# Patient Record
Sex: Female | Born: 1950
Health system: Southern US, Community
[De-identification: ages and names within clinical notes are randomized; demographics above are authoritative.]

## PROBLEM LIST (undated history)

## (undated) DIAGNOSIS — Z9889 Other specified postprocedural states: Secondary | ICD-10-CM

## (undated) DIAGNOSIS — M1990 Unspecified osteoarthritis, unspecified site: Secondary | ICD-10-CM

## (undated) DIAGNOSIS — M329 Systemic lupus erythematosus, unspecified: Secondary | ICD-10-CM

## (undated) DIAGNOSIS — E039 Hypothyroidism, unspecified: Secondary | ICD-10-CM

## (undated) DIAGNOSIS — I10 Essential (primary) hypertension: Secondary | ICD-10-CM

## (undated) DIAGNOSIS — T7840XA Allergy, unspecified, initial encounter: Secondary | ICD-10-CM

## (undated) DIAGNOSIS — R112 Nausea with vomiting, unspecified: Secondary | ICD-10-CM

## (undated) DIAGNOSIS — T8859XA Other complications of anesthesia, initial encounter: Secondary | ICD-10-CM

## (undated) DIAGNOSIS — IMO0002 Reserved for concepts with insufficient information to code with codable children: Secondary | ICD-10-CM

## (undated) HISTORY — DX: Hypothyroidism, unspecified: E03.9

## (undated) HISTORY — DX: Allergy, unspecified, initial encounter: T78.40XA

## (undated) HISTORY — PX: NECK SURGERY: SHX720

## (undated) HISTORY — DX: Essential (primary) hypertension: I10

## (undated) HISTORY — PX: APPENDECTOMY: SHX54

## (undated) HISTORY — PX: TONSILLECTOMY: SUR1361

## (undated) HISTORY — PX: VAGINAL HYSTERECTOMY: SHX2639

---

## 1995-02-10 HISTORY — PX: NECK SURGERY: SHX720

## 2008-04-18 ENCOUNTER — Ambulatory Visit (HOSPITAL_BASED_OUTPATIENT_CLINIC_OR_DEPARTMENT_OTHER): Admission: RE | Admit: 2008-04-18 | Discharge: 2008-04-18 | Payer: Self-pay | Admitting: Orthopedic Surgery

## 2010-05-22 LAB — POCT I-STAT 4, (NA,K, GLUC, HGB,HCT)
HCT: 44 % (ref 36.0–46.0)
Potassium: 4.7 mEq/L (ref 3.5–5.1)
Sodium: 144 mEq/L (ref 135–145)

## 2010-06-24 NOTE — Op Note (Signed)
NAMECARMYN, HAMM                 ACCOUNT NO.:  [id number]   MEDICAL RECORD NO.:  [id number]          PATIENT TYPE:  AMB   LOCATION:  [facility]                         FACILITY:  [facility]   PHYSICIAN:  Ollen Gross, M.D.    DATE OF BIRTH:  Dec 09, 1950   DATE OF PROCEDURE:  04/18/2008  DATE OF DISCHARGE:                               OPERATIVE REPORT   PREOPERATIVE DIAGNOSIS:  Right knee medial meniscal tear.   POSTOPERATIVE DIAGNOSIS:  Right knee medial meniscal tear.   PROCEDURE:  Right knee arthroscopy with medial meniscal debridement.   SURGEON:  Ollen Gross, M.D.   ANESTHESIA:  General.   ESTIMATED BLOOD LOSS:  Minimal.   DRAINS:  None.   COMPLICATIONS:  None.   CONDITION:  Stable to recovery.   INDICATIONS:  Ms. Saksa is a 60 year old female with a several-month  history of progressive worsening pain and mechanical symptoms on the  right knee.  Exam and history were consistent with medial meniscal tear.  MRI confirmed the tear.  She presents now for arthroscopic debridement  due to significant pain and mechanical symptoms.   PROCEDURE IN DETAIL:  After successful administration of general  anesthetic, a tourniquet was placed high on the right thigh.  Right  lower extremity prepped and draped in the usual sterile fashion.  Standard superomedial inferolateral incisions were made.  Inflow cannula  passed superomedial, camera passed inferolateral.  Arthroscopic  visualization proceeds.  Undersurface of the patella and trochlea look  normal.  Medial and lateral gutters are visualized.  There are no loose  bodies.  Flexion and valgus force applied to the knee and medial  compartment entered.  She does have a significant tear in the body and  posterior horn of the medial meniscus.  She also has significant high-  grade chondromalacia in the medial compartment.  There is no unstable  cartilage, but her cartilage is very thin on the medial femoral condyle.  Spinal needle was used  to localize the inferomedial portal.  Small  incision made and dilator placed.  The meniscus was debrided back to a  stable base with baskets and a 4.2 mm shaver and sealed off with the  ArthroCare.  The medial femoral condyle was again inspected.  There is  no unstable cartilage but just some cartilage loss.  The intercondylar  notch was visualized.  The ACL was normal.  Lateral compartment entered  and looks normal.  The rest of the joint again inspected and no other  tears or loose bodies noted.  The arthroscopic equipment is removed from  the inferior portals which are closed with interrupted 4-0 nylon  and 20 mL of 4% Marcaine with epinephrine injected through the inflow  cannula.  That is removed and that portal closed with nylon.  A bulky  sterile dressing is then applied.  She is awakened and transported to  recovery in stable condition.      Ollen Gross, M.D.  Electronically Signed     FA/MEDQ  D:  04/18/2008  T:  04/19/2008  Job:  [id number]

## 2012-07-12 DIAGNOSIS — I10 Essential (primary) hypertension: Secondary | ICD-10-CM

## 2012-10-13 ENCOUNTER — Ambulatory Visit (INDEPENDENT_AMBULATORY_CARE_PROVIDER_SITE_OTHER): Payer: Managed Care, Other (non HMO) | Admitting: Cardiology

## 2012-10-13 ENCOUNTER — Emergency Department (HOSPITAL_COMMUNITY)
Admission: EM | Admit: 2012-10-13 | Discharge: 2012-10-13 | Disposition: A | Discharge status: Home / Self Care | Payer: Managed Care, Other (non HMO) | Attending: Emergency Medicine | Admitting: Emergency Medicine

## 2012-10-13 ENCOUNTER — Encounter: Payer: Self-pay | Admitting: Cardiology

## 2012-10-13 ENCOUNTER — Encounter: Payer: Self-pay | Admitting: *Deleted

## 2012-10-13 ENCOUNTER — Emergency Department (HOSPITAL_COMMUNITY): Payer: Managed Care, Other (non HMO)

## 2012-10-13 ENCOUNTER — Encounter (HOSPITAL_COMMUNITY): Payer: Self-pay | Admitting: *Deleted

## 2012-10-13 VITALS — BP 122/74 | HR 76 | Ht 66.0 in | Wt 150.8 lb

## 2012-10-13 DIAGNOSIS — R0789 Other chest pain: Secondary | ICD-10-CM

## 2012-10-13 DIAGNOSIS — Z79899 Other long term (current) drug therapy: Secondary | ICD-10-CM | POA: Insufficient documentation

## 2012-10-13 DIAGNOSIS — R079 Chest pain, unspecified: Secondary | ICD-10-CM

## 2012-10-13 DIAGNOSIS — R072 Precordial pain: Secondary | ICD-10-CM

## 2012-10-13 DIAGNOSIS — Z9089 Acquired absence of other organs: Secondary | ICD-10-CM | POA: Insufficient documentation

## 2012-10-13 DIAGNOSIS — E039 Hypothyroidism, unspecified: Secondary | ICD-10-CM | POA: Insufficient documentation

## 2012-10-13 DIAGNOSIS — R071 Chest pain on breathing: Secondary | ICD-10-CM | POA: Insufficient documentation

## 2012-10-13 DIAGNOSIS — Z8701 Personal history of pneumonia (recurrent): Secondary | ICD-10-CM | POA: Insufficient documentation

## 2012-10-13 DIAGNOSIS — I10 Essential (primary) hypertension: Secondary | ICD-10-CM | POA: Insufficient documentation

## 2012-10-13 LAB — BASIC METABOLIC PANEL
CO2: 26 mEq/L (ref 19–32)
Calcium: 9.7 mg/dL (ref 8.4–10.5)
GFR calc Af Amer: 79 mL/min — ABNORMAL LOW (ref >90)
GFR calc non Af Amer: 68 mL/min — ABNORMAL LOW (ref >90)
Sodium: 140 mEq/L (ref 135–145)

## 2012-10-13 LAB — POCT I-STAT TROPONIN I: Troponin i, poc: 0 ng/mL (ref 0.00–0.08)

## 2012-10-13 LAB — CBC
MCH: 30.9 pg (ref 26.0–34.0)
MCHC: 34.4 g/dL (ref 30.0–36.0)
Platelets: 273 10*3/uL (ref 150–400)
RBC: 4.46 MIL/uL (ref 3.87–5.11)

## 2012-10-13 LAB — PRO B NATRIURETIC PEPTIDE: Pro B Natriuretic peptide (BNP): 34.4 pg/mL (ref 0–125)

## 2012-10-13 MED ORDER — NAPROXEN 500 MG PO TABS: 500.0000 mg | ORAL_TABLET | Freq: Two times a day (BID) | ORAL | Status: DC

## 2012-10-13 NOTE — ED Provider Notes (Signed)
CSN: [id number]     Arrival date & time 10/13/12  1537 History   First MD Initiated Contact with Patient 10/13/12 1721     Chief Complaint  Patient presents with  . Chest Pain   (Consider location/radiation/quality/duration/timing/severity/associated sxs/prior Treatment) Patient is a 62 y.o. female presenting with chest pain.  Chest Pain  Pt with no significant PMH has had pleuritic chest pain for the last several weeks since diagnosed with pneumonia. She reports aching pain in mid chest, worse with deep breath and lying on her side. No fever, but still some residual cough. She denies any leg swelling, orthopnea or DOE. She was advised by an Urgent Care in [place] to followup with Cardiology. She saw Dr. Antoine Poche earlier today who felt that her symptoms were likely due to residual inflammation from pneumonia and recommended pulmonology followup but also scheduled stress test to be done. Patient was concerned that it would take weeks to arrange followup and so came to the ED after leaving the cardiology office.   Past Medical History  Diagnosis Date  . Hypertension   . Hypothyroid    Past Surgical History  Procedure Laterality Date  . Tonsillectomy    . Appendectomy    . Neck surgery      Pinched Nerve  . Vaginal hysterectomy     Family History  Problem Relation Age of Onset  . Cancer Sister     Ovarian  . Cancer Mother     Breast   History  Substance Use Topics  . Smoking status: Never Smoker   . Smokeless tobacco: Not on file  . Alcohol Use: Not on file   OB History   Grav Para Term Preterm Abortions TAB SAB Ect Mult Living                 Review of Systems  Cardiovascular: Positive for chest pain.   All other systems reviewed and are negative except as noted in HPI.   Allergies  Review of patient's allergies indicates no known allergies.  Home Medications   Current Outpatient Rx  Name  Route  Sig  Dispense  Refill  . benazepril (LOTENSIN) 10 MG tablet  Oral   Take 10 mg by mouth daily.         . Cholecalciferol (VITAMIN D3) 2000 UNITS TABS   Oral   Take 1 tablet by mouth.         . levothyroxine (SYNTHROID, LEVOTHROID) 75 MCG tablet   Oral   Take 75 mcg by mouth daily before breakfast.         . Menthol (HALLS COUGH DROPS MT)   Mouth/Throat   Use as directed 1 lozenge in the mouth or throat daily as needed (for sore throat).         . naproxen sodium (ANAPROX) 220 MG tablet   Oral   Take 220 mg by mouth 2 (two) times daily with a meal.         . PARoxetine (PAXIL) 20 MG tablet   Oral   Take 20 mg by mouth every morning.          BP 153/76  Pulse 92  Temp(Src) 98.3 F (36.8 C) (Oral)  Resp 20  SpO2 99% Physical Exam  Nursing note and vitals reviewed. Constitutional: She is oriented to person, place, and time. She appears well-developed and well-nourished.  HENT:  Head: Normocephalic and atraumatic.  Eyes: EOM are normal. Pupils are equal, round, and reactive to light.  Neck: Normal range of motion. Neck supple.  Cardiovascular: Normal rate, normal heart sounds and intact distal pulses.   Pulmonary/Chest: Effort normal and breath sounds normal. She has no wheezes. She has no rales. She exhibits tenderness (midsternal).  Abdominal: Bowel sounds are normal. She exhibits no distension. There is no tenderness.  Musculoskeletal: Normal range of motion. She exhibits no edema and no tenderness.  Neurological: She is alert and oriented to person, place, and time. She has normal strength. No cranial nerve deficit or sensory deficit.  Skin: Skin is warm and dry. No rash noted.  Psychiatric: She has a normal mood and affect.    ED Course  Procedures (including critical care time) Labs Review Labs Reviewed  BASIC METABOLIC PANEL - Abnormal; Notable for the following:    Glucose, Bld 178 (*)    GFR calc non Af Amer 68 (*)    GFR calc Af Amer 79 (*)    All other components within normal limits  CBC  PRO B  NATRIURETIC PEPTIDE  POCT I-STAT TROPONIN I   Imaging Review Dg Chest 2 View  10/13/2012   *RADIOLOGY REPORT*  Clinical Data: Cough and chest pain  CHEST - 2 VIEW  Comparison: None.  Findings: Lungs clear.  Heart size and pulmonary vascularity are normal.  No adenopathy.  No bone lesions.  There is degenerative change in the thoracic spine.  Aorta is mildly tortuous.  IMPRESSION: No edema or consolidation.   Original Report Authenticated By: Bretta Bang, M.D.    MDM   1. Chest wall pain      Date: 10/13/2012  Rate: 91  Rhythm: normal sinus rhythm  QRS Axis: normal  Intervals: normal  ST/T Wave abnormalities: normal  Conduction Disutrbances: none  Narrative Interpretation: unremarkable  Pt with pleuritic chest pain ongoing for weeks after pneumonia already evaluated by cardiology and scheduled for outpatient stress test. No concern for ACS, PE, PNA, PTX or other life threatening cause of her symptoms. Labs and imaging ordered in triage neg. Will rx NSAIDs, referred to local pulmonology as well.        Charles B. Bernette Mayers, MD 10/13/12 1757

## 2012-10-13 NOTE — ED Notes (Signed)
Reports recent pneumonia, still having sob and a mid/upper chest tightness. Went to cardio today and was told may need to see pulmonologist, did not think it was cardiac. Pt appears in no acute distress. ekg done at triage. resp e/u.

## 2012-10-13 NOTE — ED Notes (Signed)
Pt not in room.

## 2012-10-13 NOTE — Progress Notes (Signed)
HPI The patient presents for evaluation of chest discomfort. She has no prior cardiac history. In July she had pneumonia. She said that probably since then and at least over the last several weeks she has had chest discomfort. It is midsternal. It is having heavy and it is constant. It hurts worse when she is laying on one side. It is worse with inspiration. With it she has a dry hacking nonproductive cough.  In late August he was speaking she went to see a physician in urgent care. She had negative troponin and d-dimer. An EKG was nonacute. Chest x-ray demonstrated some lingular scarring. I have reviewed these records. However, because this is ongoing he was referred for possible cardiac evaluation. She said that she thinks she is active. She dances. With this she cannot bring on any of the symptoms. She does not describe fevers or chills. She does not describe PND or orthopnea. She does not have palpitations, presyncope or syncope.  Not on File  Current Outpatient Prescriptions  Medication Sig Dispense Refill  . benazepril (LOTENSIN) 10 MG tablet Take 10 mg by mouth daily.      . Cholecalciferol (VITAMIN D3) 2000 UNITS TABS Take 1 tablet by mouth.      . levothyroxine (SYNTHROID, LEVOTHROID) 75 MCG tablet Take 75 mcg by mouth daily before breakfast.      . PARoxetine (PAXIL) 20 MG tablet Take 20 mg by mouth every morning.       No current facility-administered medications for this visit.    Past Medical History  Diagnosis Date  . Hypertension     Past Surgical History  Procedure Laterality Date  . Tonsillectomy    . Appendectomy    . Neck surgery      Pinched Nerve  . Vaginal hysterectomy      Family History  Problem Relation Age of Onset  . Heart attack Mother   . Cancer Sister     History   Social History  . Marital Status: Divorced    Spouse Name: N/A    Number of Children: N/A  . Years of Education: N/A   Occupational History  . Not on file.   Social History  Main Topics  . Smoking status: Never Smoker   . Smokeless tobacco: Not on file  . Alcohol Use: Not on file  . Drug Use: Not on file  . Sexual Activity: Not on file   Other Topics Concern  . Not on file   Social History Narrative  . No narrative on file    ROS: Positive for reflux, joint pains. Otherwise, as stated in the HPI and negative for all other systems.  PHYSICAL EXAM BP 122/74  Pulse 76  Ht 5\' 6"  (1.676 m)  Wt 150 lb 12.8 oz (68.402 kg)  BMI 24.35 kg/m2 GENERAL:  Well appearing HEENT:  Pupils equal round and reactive, fundi not visualized, oral mucosa unremarkable NECK:  No jugular venous distention, waveform within normal limits, carotid upstroke brisk and symmetric, no bruits, no thyromegaly LYMPHATICS:  No cervical, inguinal adenopathy LUNGS:  Clear to auscultation bilaterally BACK:  No CVA tenderness CHEST:  Unremarkable HEART:  PMI not displaced or sustained,S1 and S2 within normal limits, no S3, no S4, no clicks, no rubs, no murmurs ABD:  Flat, positive bowel sounds normal in frequency in pitch, no bruits, no rebound, no guarding, no midline pulsatile mass, no hepatomegaly, no splenomegaly EXT:  2 plus pulses throughout, no edema, no cyanosis no clubbing SKIN:  No rashes no nodules NEURO:  Cranial nerves II through XII grossly intact, motor grossly intact throughout PSYCH:  Cognitively intact, oriented to person place and time   EKG:  Sinus rhythm, rate 76, axis within normal limits, intervals within normal limits, no acute ST-T wave changes.  10/13/2012  ASSESSMENT AND PLAN  CHEST PAIN:  The pretest probability of obstructive coronary disease is low. I will bring the patient back for a POET (Plain Old Exercise Test). This will allow me to screen for obstructive coronary disease, risk stratify and very importantly provide a prescription for exercise.  It this is negative I might suggest some postinflammatory/pneumonia type syndrome although her d-dimer was normal.  It might be prudent at that point given the cough to see a pulmonologist.  HTN:  The blood pressure is at target. No change in medications is indicated. We will continue with therapeutic lifestyle changes (TLC).

## 2012-10-13 NOTE — Patient Instructions (Addendum)
Your physician has requested that you have an exercise tolerance test. For further information please visit [email or web address]. Please also follow instruction sheet, as given.  Follow up as needed.  

## 2012-10-14 DIAGNOSIS — R072 Precordial pain: Secondary | ICD-10-CM | POA: Insufficient documentation

## 2012-10-17 ENCOUNTER — Ambulatory Visit (INDEPENDENT_AMBULATORY_CARE_PROVIDER_SITE_OTHER): Payer: Managed Care, Other (non HMO) | Admitting: Internal Medicine

## 2012-10-17 ENCOUNTER — Encounter: Payer: Self-pay | Admitting: Internal Medicine

## 2012-10-17 VITALS — BP 150/84 | HR 81 | Temp 98.4°F | Ht 66.0 in | Wt 152.0 lb

## 2012-10-17 DIAGNOSIS — I10 Essential (primary) hypertension: Secondary | ICD-10-CM | POA: Insufficient documentation

## 2012-10-17 DIAGNOSIS — R072 Precordial pain: Secondary | ICD-10-CM

## 2012-10-17 DIAGNOSIS — R05 Cough: Secondary | ICD-10-CM | POA: Insufficient documentation

## 2012-10-17 MED ORDER — PANTOPRAZOLE SODIUM 40 MG PO TBEC: 40.0000 mg | DELAYED_RELEASE_TABLET | Freq: Every day | ORAL | Status: DC

## 2012-10-17 MED ORDER — VALSARTAN 160 MG PO TABS: 160.0000 mg | ORAL_TABLET | Freq: Every day | ORAL | Status: DC

## 2012-10-17 MED ORDER — FAMOTIDINE 20 MG PO TABS: ORAL_TABLET | ORAL | Status: DC

## 2012-10-17 MED ORDER — TRAMADOL HCL 50 MG PO TABS: ORAL_TABLET | ORAL | Status: DC

## 2012-10-17 MED ORDER — PREDNISONE (PAK) 10 MG PO TABS: ORAL_TABLET | ORAL | Status: DC

## 2012-10-17 NOTE — Progress Notes (Addendum)
  Subjective:    Patient ID: Colletta [place], female    DOB: [birth year], 62 y.o.   MRN: [id number]  HPI  25 yowf never smoker never respiratory disorder of any kind referred 10/17/2012 by Dr Italy Shelton in [facility] er for cough with assoc chest pressure with neg initial cardiac w/u by Dr Antoine Poche  10/17/2012 1st [facility] office visit/ Rayley Gao on ACEi new cough abruptly with dx pna in July 2014 with fever cough > prod yellow rx eval in [place] > rx levaquin fever resolved but cough and chest tightness persistent.  Worse with L side down but really present 24 h per day  No obvious daytime variabilty or assoc lateralizing pleurtic or ex cp  , subjective wheeze overt sinus or hb symptoms. No unusual exp hx or h/o childhood pna/ asthma or knowledge of premature birth.   Sleeping ok without nocturnal  or early am exacerbation  of respiratory  c/o's or need for noct saba. Also denies any obvious fluctuation of symptoms with weather or environmental changes or other aggravating or alleviating factors except as outlined above   Current Medications, Allergies, Complete Past Medical History, Past Surgical History, Family History, and Social History were reviewed in [employer] record.         Review of Systems  Constitutional: Negative for fever, chills and unexpected weight change.  HENT: Negative for ear pain, nosebleeds, congestion, sore throat, rhinorrhea, sneezing, trouble swallowing, dental problem, voice change, postnasal drip and sinus pressure.   Eyes: Negative for visual disturbance.  Respiratory: Positive for cough. Negative for choking and shortness of breath.   Cardiovascular: Negative for chest pain and leg swelling.       "chest pressure"  Gastrointestinal: Negative for vomiting, abdominal pain and diarrhea.  Genitourinary: Negative for difficulty urinating.  Musculoskeletal: Positive for arthralgias.  Skin: Negative for rash.  Neurological: Negative for  tremors, syncope and headaches.  Hematological: Does not bruise/bleed easily.       Objective:   Physical Exam Amb wf with classic brassy upper airway cough heard across the office   Wt Readings from Last 3 Encounters:  10/17/12 152 lb (68.947 kg)  10/13/12 150 lb 12.8 oz (68.402 kg)     HEENT: nl dentition, turbinates, and orophanx. Nl external ear canals without cough reflex   NECK :  without JVD/Nodes/TM/ nl carotid upstrokes bilaterally   LUNGS: no acc muscle use, clear to A and P bilaterally without cough on insp or exp maneuvers   CV:  RRR  no s3 or murmur or increase in P2, no edema   ABD:  soft and nontender with nl excursion in the supine position. No bruits or organomegaly, bowel sounds nl  MS:  warm without deformities, calf tenderness, cyanosis or clubbing  SKIN: warm and dry without lesions    NEURO:  alert, approp, no deficits    CXR 10/13/12  No edema or consolidation.        Assessment & Plan:

## 2012-10-17 NOTE — Assessment & Plan Note (Signed)
C/w mscp from cough > rx tramadol

## 2012-10-17 NOTE — Assessment & Plan Note (Signed)
ACE inhibitors are problematic in  pts with airway complaints because  even experienced pulmonologists can't always distinguish ace effects from copd/asthma/pnds/ allergies etc.  By themselves they don't actually cause a problem, much like oxygen can't by itself start a fire, but they certainly serve as a powerful catalyst or enhancer for any "fire"  or inflammatory process in the upper airway, be it caused by an ET  tube or more commonly reflux (especially in the obese or pts with known GERD or who are on biphoshonates) or URI's, due to interference with bradykinin clearance.  The effects of acei on bradykinin levels occurs in 100% of pt's on acei (unless they surreptitiously stop the med!) but the classic cough is only reported in 5%.  This leaves 95% of pts on acei's  with a variety of syndromes including no identifiable symptom in most  vs non-specific symptoms that wax and wane depending on what other insult is occuring at the level of the upper airway, in this case likely secondary gerd initially triggered by cough from uri/pna.  Try diovan 160 mg daily and f/u in 4 weeks prn

## 2012-10-17 NOTE — Assessment & Plan Note (Signed)
The most common causes of chronic cough in immunocompetent adults include the following: upper airway cough syndrome (UACS), previously referred to as postnasal drip syndrome (PNDS), which is caused by variety of rhinosinus conditions; (2) asthma; (3) GERD; (4) chronic bronchitis from cigarette smoking or other inhaled environmental irritants; (5) nonasthmatic eosinophilic bronchitis; and (6) bronchiectasis.   These conditions, singly or in combination, have accounted for up to 94% of the causes of chronic cough in prospective studies.   Other conditions have constituted no >6% of the causes in prospective studies These have included bronchogenic carcinoma, chronic interstitial pneumonia, sarcoidosis, left ventricular failure, ACEI-induced cough, and aspiration from a condition associated with pharyngeal dysfunction.    Chronic cough is often simultaneously caused by more than one condition. A single cause has been found from 38 to 82% of the time, multiple causes from 18 to 62%. Multiply caused cough has been the result of three diseases up to 42% of the time.       Most likely this is  Classic Upper airway cough syndrome, so named because it's frequently impossible to sort out how much is  CR/sinusitis with freq throat clearing (which can be related to primary GERD)   vs  causing  secondary (" extra esophageal")  GERD from wide swings in gastric pressure that occur with throat clearing, often  promoting self use of mint and menthol lozenges that reduce the lower esophageal sphincter tone and exacerbate the problem further in a cyclical fashion.   These are the same pts (now being labeled as having "irritable larynx syndrome" by some cough centers) who not infrequently have a history of having failed to tolerate ace inhibitors,  dry powder inhalers or biphosphonates or report having atypical reflux symptoms that don't respond to standard doses of PPI , and are easily confused as having aecopd or asthma  flares by even experienced allergists/ pulmonologists.   rec rx gerd/ cyclical cough and off acei for a min of 4 weeks (see hbp)

## 2012-10-17 NOTE — Patient Instructions (Signed)
Stop lotensin and halls  Start diovan 160 mg one daily one daily    Take delsym two tsp every 12 hours and supplement if needed with  tramadol 50 mg up to 2 every 4 hours to suppress the urge to cough. Swallowing water or using ice chips/non mint and menthol containing candies (such as lifesavers or sugarless jolly ranchers) are also effective.  You should rest your voice and avoid activities that you know make you cough.  Once you have eliminated the cough for 3 straight days try reducing the tramadol first,  then the delsym as tolerated.     Prednisone 10 mg take  4 each am x 2 days,   2 each am x 2 days,  1 each am x 2 days and stop   Pantoprazole (protonix) 40 mg   Take 30-60 min before first meal of the day and Pepcid 20 mg one bedtime until cough and tightness are gone for at least a week without thee need for cough medication   If you are satisfied with your treatment plan let your doctor know and he/she can either refill your medications or you can return here when your prescription runs out.     If in any way you are not 100% satisfied,  please tell us.  If 100% better, tell your friends!

## 2012-11-08 ENCOUNTER — Encounter: Payer: Managed Care, Other (non HMO) | Admitting: Physician Assistant

## 2013-01-11 ENCOUNTER — Encounter: Payer: Self-pay | Admitting: Gastroenterology

## 2013-02-01 ENCOUNTER — Encounter: Payer: Self-pay | Admitting: Gastroenterology

## 2013-02-01 ENCOUNTER — Ambulatory Visit (INDEPENDENT_AMBULATORY_CARE_PROVIDER_SITE_OTHER): Payer: Managed Care, Other (non HMO) | Admitting: Gastroenterology

## 2013-02-01 VITALS — BP 110/70 | HR 80 | Ht 66.0 in | Wt 153.2 lb

## 2013-02-01 DIAGNOSIS — R079 Chest pain, unspecified: Secondary | ICD-10-CM

## 2013-02-01 DIAGNOSIS — R1013 Epigastric pain: Secondary | ICD-10-CM

## 2013-02-01 MED ORDER — DEXLANSOPRAZOLE 60 MG PO CPDR: 60.0000 mg | DELAYED_RELEASE_CAPSULE | Freq: Every day | ORAL | Status: DC

## 2013-02-01 NOTE — Patient Instructions (Signed)
Stop taking Protonix and start Dexilant samples one tablet by mouth once daily.  We have sent the following medications to your pharmacy for you to pick up at your convenience: Dexilant.  You have been scheduled for an abdominal ultrasound at [facility] (1st floor of hospital) on 02/06/13 at 9:30am. Please arrive 15 minutes prior to your appointment for registration. Make certain not to have anything to eat or drink 6 hours prior to your appointment. Should you need to reschedule your appointment, please contact radiology at [phone]. This test typically takes about 30 minutes to perform.  You have been scheduled for an endoscopy with propofol. Please follow written instructions given to you at your visit today. If you use inhalers (even only as needed), please bring them with you on the day of your procedure.  Take Naprosyn or Advil for your chest pain.   Thank you for choosing me and [facility].  Venita Lick. Pleas Koch., MD., Clementeen Graham

## 2013-02-01 NOTE — Progress Notes (Signed)
    History of Present Illness: This is a 62 year old female accompanied by her daughter. She was treated for pneumonia in July and developed problems with intermittent mid chest and epigastric pressure and a persistent cough. She was evaluated by cardiology and pulmonary. Dr. Sherene Sires daily discontinued her ACE inhibitor and started pantoprazole . Her cough is substantially improved. Her intermittent chest pressure and epigastric pressure have not improved. Occasionally her epigastric and chest pressure are relieved by meals. These symptoms occur intermittently and are not necessarily related to meals, swallowing, movement or any other activity. She underwent colonoscopy by Dr. Karilyn Cota on October 2010 showing mild sigmoid colon diverticulosis and an area of edema in the splenic flexure which was biopsied and biopsies revealed only mild edema. Denies weight loss, abdominal pain, constipation, diarrhea, change in stool caliber, melena, hematochezia, nausea, vomiting, dysphagia, reflux symptoms.  Review of Systems: Pertinent positive and negative review of systems were noted in the above HPI section. All other review of systems were otherwise negative.  Current Medications, Allergies, Past Medical History, Past Surgical History, Family History and Social History were reviewed in [employer] record.  Physical Exam: General: Well developed , well nourished, no acute distress Head: Normocephalic and atraumatic Eyes:  sclerae anicteric, EOMI Ears: Normal auditory acuity Mouth: No deformity or lesions Neck: Supple, no masses or thyromegaly Lungs: Clear throughout to auscultation, mild midsternal tenderness to palpation Heart: Regular rate and rhythm; no murmurs, rubs or bruits Abdomen: Soft, non tender and non distended. No masses, hepatosplenomegaly or hernias noted. Normal Bowel sounds Musculoskeletal: Symmetrical with no gross deformities  Skin: No lesions on visible  extremities Pulses:  Normal pulses noted Extremities: No clubbing, cyanosis, edema or deformities noted Neurological: Alert oriented x 4, grossly nonfocal Cervical Nodes:  No significant cervical adenopathy Inguinal Nodes: No significant inguinal adenopathy Psychological:  Alert and cooperative. Normal mood and affect  Assessment and Recommendations:  1. Chest and epigastric pain and pressure. No response to pantoprazole. Chest symptoms exacerbated by palpation during physical exam. This does not appear to be GERD. I suspect musculoskeletal symptoms. Given the persistent nature of her symptoms further evaluation with upper endoscopy and abdominal ultrasound is indicated. Trial of dexlansoprazole 60 mg daily for possible GERD or gastritis and NSAIDs daily for suspected musculoskeletal chest pain. The risks, benefits, and alternatives to endoscopy with possible biopsy and possible dilation were discussed with the patient and they consent to proceed. Schedule abdominal ultrasound.

## 2013-02-03 ENCOUNTER — Encounter (HOSPITAL_COMMUNITY): Payer: Self-pay | Admitting: Emergency Medicine

## 2013-02-03 DIAGNOSIS — R109 Unspecified abdominal pain: Secondary | ICD-10-CM | POA: Insufficient documentation

## 2013-02-03 DIAGNOSIS — I10 Essential (primary) hypertension: Secondary | ICD-10-CM | POA: Insufficient documentation

## 2013-02-03 LAB — CBC
MCH: 31.3 pg (ref 26.0–34.0)
MCHC: 33.9 g/dL (ref 30.0–36.0)
Platelets: 236 10*3/uL (ref 150–400)
RDW: 14.2 % (ref 11.5–15.5)

## 2013-02-03 LAB — COMPREHENSIVE METABOLIC PANEL
ALT: 16 U/L (ref 0–35)
AST: 23 U/L (ref 0–37)
Calcium: 8.9 mg/dL (ref 8.4–10.5)
GFR calc Af Amer: 75 mL/min — ABNORMAL LOW (ref >90)
Sodium: 140 mEq/L (ref 135–145)
Total Protein: 7.1 g/dL (ref 6.0–8.3)

## 2013-02-03 LAB — AMYLASE: Amylase: 85 U/L (ref 0–105)

## 2013-02-03 NOTE — ED Notes (Signed)
Pt here from home with abd pain , pt is due to have an [place] this coming Monday , pt states that she has not been able to keep anything down

## 2013-02-04 ENCOUNTER — Emergency Department (HOSPITAL_COMMUNITY)
Admission: EM | Admit: 2013-02-04 | Discharge: 2013-02-04 | Discharge status: Against Medical Advice | Payer: Managed Care, Other (non HMO) | Attending: Emergency Medicine | Admitting: Emergency Medicine

## 2013-02-06 ENCOUNTER — Ambulatory Visit (HOSPITAL_COMMUNITY)
Admission: RE | Admit: 2013-02-06 | Discharge: 2013-02-06 | Disposition: A | Discharge status: Home / Self Care | Payer: Managed Care, Other (non HMO) | Source: Ambulatory Visit | Attending: Gastroenterology | Admitting: Gastroenterology

## 2013-02-06 ENCOUNTER — Encounter: Payer: Self-pay | Admitting: Gastroenterology

## 2013-02-06 DIAGNOSIS — R1013 Epigastric pain: Secondary | ICD-10-CM

## 2013-02-06 DIAGNOSIS — R109 Unspecified abdominal pain: Secondary | ICD-10-CM | POA: Insufficient documentation

## 2013-02-10 ENCOUNTER — Telehealth: Payer: Self-pay | Admitting: Gastroenterology

## 2013-02-10 NOTE — Telephone Encounter (Signed)
Patient is rescheduled back to 02/14/13.  She knows to call me back on Monday if she still has a fever.

## 2013-02-10 NOTE — Telephone Encounter (Signed)
Her procedure is 4 days from now so she easily could be better by 1/6. I would like her to proceed as scheduled on 1/6.  If she is going to cancel anyway then charge.

## 2013-02-14 ENCOUNTER — Ambulatory Visit (AMBULATORY_SURGERY_CENTER): Payer: Managed Care, Other (non HMO) | Admitting: Gastroenterology

## 2013-02-14 ENCOUNTER — Encounter: Payer: Self-pay | Admitting: Gastroenterology

## 2013-02-14 ENCOUNTER — Encounter: Payer: Managed Care, Other (non HMO) | Admitting: Gastroenterology

## 2013-02-14 VITALS — BP 120/74 | HR 70 | Temp 98.3°F | Resp 25 | Ht 66.0 in | Wt 153.0 lb

## 2013-02-14 DIAGNOSIS — R1013 Epigastric pain: Secondary | ICD-10-CM

## 2013-02-14 DIAGNOSIS — R079 Chest pain, unspecified: Secondary | ICD-10-CM

## 2013-02-14 MED ORDER — SODIUM CHLORIDE 0.9 % IV SOLN: 500.0000 mL | INTRAVENOUS | Status: DC

## 2013-02-14 NOTE — Progress Notes (Signed)
Patient did not experience any of the following events: a burn prior to discharge; a fall within the facility; wrong site/side/patient/procedure/implant event; or a hospital transfer or hospital admission upon discharge from the facility. (G8907)Patient did not have preoperative order for IV antibiotic SSI prophylaxis. ([id number]) ewm 

## 2013-02-14 NOTE — Op Note (Signed)
[facility]  [address] [place], [id number]   ENDOSCOPY PROCEDURE REPORT  PATIENT: Sheryl Hamilton, Sheryl Hamilton  MR#: [id number] BIRTHDATE: 25-Aug-1950 , 68  yrs. old GENDER: Female ENDOSCOPIST: Ladene Artist, MD, [facility] REFERRED BY:  Matthias Hughs, M.D. PROCEDURE DATE:  02/14/2013 PROCEDURE:  EGD, diagnostic ASA CLASS:     Class II INDICATIONS:  Chest pain.   Epigastric pain. MEDICATIONS: MAC sedation, administered by CRNA and propofol (Diprivan) 200mg  IV, Zofran 4 mg IV TOPICAL ANESTHETIC: Cetacaine Spray DESCRIPTION OF PROCEDURE: After the risks benefits and alternatives of the procedure were thoroughly explained, informed consent was obtained.  The LB [id number] endoscope was introduced through the mouth and advanced to the second portion of the duodenum. Without limitations.  The instrument was slowly withdrawn as the mucosa was fully examined.  ESOPHAGUS: The mucosa of the esophagus appeared normal. STOMACH: The mucosa and folds of the stomach appeared normal. DUODENUM: The duodenal mucosa showed no abnormalities in the bulb and second portion of the duodenum. Retroflexed views revealed no abnormalities.   The scope was then withdrawn from the patient and the procedure completed.  COMPLICATIONS: There were no complications.  ENDOSCOPIC IMPRESSION: 1.   The EGD appeared normal  RECOMMENDATIONS: 1. No GI cause for her symptoms found 2. Call to schedule a follow-up appointment with primary MD 1 month   eSigned:  Ladene Artist, MD, [facility] 02/14/2013 8:14 AM

## 2013-02-14 NOTE — Patient Instructions (Signed)
YOU HAD AN ENDOSCOPIC PROCEDURE TODAY AT [facility]: Refer to the procedure report that was given to you for any specific questions about what was found during the examination.  If the procedure report does not answer your questions, please call your gastroenterologist to clarify.  If you requested that your care partner not be given the details of your procedure findings, then the procedure report has been included in a sealed envelope for you to review at your convenience later.  YOU SHOULD EXPECT: Some feelings of bloating in the abdomen. Passage of more gas than usual.  Walking can help get rid of the air that was put into your GI tract during the procedure and reduce the bloating.   DIET: Your first meal following the procedure should be a light meal and then it is ok to progress to your normal diet.  A half-sandwich or bowl of soup is an example of a good first meal.  Heavy or fried foods are harder to digest and may make you feel nauseous or bloated.  Likewise meals heavy in dairy and vegetables can cause extra gas to form and this can also increase the bloating.  Drink plenty of fluids but you should avoid alcoholic beverages for 24 hours.  ACTIVITY: Your care partner should take you home directly after the procedure.  You should plan to take it easy, moving slowly for the rest of the day.  You can resume normal activity the day after the procedure however you should NOT DRIVE or use heavy machinery for 24 hours (because of the sedation medicines used during the test).    SYMPTOMS TO REPORT IMMEDIATELY: A gastroenterologist can be reached at any hour.  During normal business hours, 8:30 AM to 5:00 PM Monday through Friday, call [phone].  After hours and on weekends, please call the GI answering service at [phone] emergency number  who will take a message and have the physician on call contact you.   Following upper endoscopy (EGD)  Vomiting of blood or coffee  ground material  New chest pain or pain under the shoulder blades  Painful or persistently difficult swallowing  New shortness of breath  Fever of 100F or higher  Black, tarry-looking stools  FOLLOW UP: If any biopsies were taken you will be contacted by phone or by letter within the next 1-3 weeks.  Call your gastroenterologist if you have not heard about the biopsies in 3 weeks.  Our staff will call the home number listed on your records the next business day following your procedure to check on you and address any questions or concerns that you may have at that time regarding the information given to you following your procedure. This is a courtesy call and so if there is no answer at the home number and we have not heard from you through the emergency physician on call, we will assume that you have returned to your regular daily activities without incident.  SIGNATURES/CONFIDENTIALITY: You and/or your care partner have signed paperwork which will be entered into your electronic medical record.  These signatures attest to the fact that that the information above on your After Visit Summary has been reviewed and is understood.  Full responsibility of the confidentiality of this discharge information lies with you and/or your care-partner.  Normal endoscopy today Call to schedule a follow up appointment with your primary doctor in 1 month

## 2013-02-14 NOTE — Progress Notes (Signed)
Lidocaine-40mg IV prior to Propofol InductionPropofol given over incremental dosages 

## 2013-02-15 ENCOUNTER — Telehealth: Payer: Self-pay | Admitting: *Deleted

## 2013-02-15 NOTE — Telephone Encounter (Signed)
  Follow up Call-  Call back number 02/14/2013  Post procedure Call Back phone  # [phone]  Permission to leave phone message Yes     Patient questions:  Do you have a fever, pain , or abdominal swelling? no Pain Score  0 *  Have you tolerated food without any problems? yes  Have you been able to return to your normal activities? yes  Do you have any questions about your discharge instructions: Diet   no Medications  no Follow up visit  no  Do you have questions or concerns about your Care? no  Actions: * If pain score is 4 or above: No action needed, pain <4.

## 2013-02-28 ENCOUNTER — Encounter: Payer: Managed Care, Other (non HMO) | Admitting: Gastroenterology

## 2013-08-21 ENCOUNTER — Inpatient Hospital Stay: Admit: 2013-08-21 | Payer: Self-pay | Admitting: Orthopedic Surgery

## 2013-08-21 SURGERY — ARTHROPLASTY, KNEE, TOTAL
Anesthesia: Choice | Site: Knee | Laterality: Left

## 2014-03-29 ENCOUNTER — Ambulatory Visit: Payer: Self-pay | Admitting: Orthopedic Surgery

## 2014-04-30 ENCOUNTER — Inpatient Hospital Stay (HOSPITAL_COMMUNITY)
Admission: RE | Admit: 2014-04-30 | Payer: Managed Care, Other (non HMO) | Source: Ambulatory Visit | Admitting: Orthopedic Surgery

## 2014-04-30 ENCOUNTER — Encounter (HOSPITAL_COMMUNITY): Admission: RE | Payer: Self-pay | Source: Ambulatory Visit

## 2014-04-30 SURGERY — ARTHROPLASTY, KNEE, TOTAL
Anesthesia: Choice | Site: Knee | Laterality: Right

## 2014-05-24 ENCOUNTER — Ambulatory Visit: Payer: Self-pay | Admitting: Orthopedic Surgery

## 2014-05-24 NOTE — Progress Notes (Signed)
Preoperative surgical orders have been place into the Epic hospital system for Sheryl Hamilton on 05/24/2014, 6:15 PM  by Mickel Crow for surgery on 06-11-2014.  Preop Total Knee orders including Experal, IV Tylenol, and IV Decadron as long as there are no contraindications to the above medications. Arlee Muslim, PA-C

## 2014-05-29 NOTE — H&P (Signed)
TOTAL KNEE ADMISSION H&P  Patient is being admitted for left total knee arthroplasty.  Subjective:  Chief Complaint:left knee pain.  HPI: Sheryl Hamilton, 64 y.o. female, has a history of pain and functional disability in the left knee due to arthritis and has failed non-surgical conservative treatments for greater than 12 weeks to includeNSAID's and/or analgesics, corticosteriod injections and activity modification.  Onset of symptoms was gradual, starting >10 years ago with gradually worsening course since that time. The patient noted no past surgery on the left knee(s).  Patient currently rates pain in the left knee(s) at 7 out of 10 with activity. Patient has night pain, worsening of pain with activity and weight bearing, pain that interferes with activities of daily living, pain with passive range of motion, crepitus and joint swelling.  Patient has evidence of periarticular osteophytes and joint space narrowing by imaging studies. There is no active infection.  Patient Active Problem List   Diagnosis Date Noted  . Cough 10/17/2012  . HBP (high blood pressure) 10/17/2012  . Precordial pain 10/14/2012   Past Medical History  Diagnosis Date  . Hypertension   . Hypothyroid   . Allergy     seasonal    Past Surgical History  Procedure Laterality Date  . Tonsillectomy    . Appendectomy    . Neck surgery      Pinched Nerve  . Vaginal hysterectomy        Current outpatient prescriptions:  .  Cholecalciferol (VITAMIN D3) 2000 UNITS TABS, Take 1 tablet by mouth., Disp: , Rfl:  .  dexlansoprazole (DEXILANT) 60 MG capsule, Take 1 capsule (60 mg total) by mouth daily., Disp: 30 capsule, Rfl: 11 .  levothyroxine (SYNTHROID, LEVOTHROID) 75 MCG tablet, Take 75 mcg by mouth daily before breakfast., Disp: , Rfl:  .  losartan (COZAAR) 100 MG tablet, Take 100 mg by mouth daily., Disp: , Rfl:  .  PARoxetine (PAXIL) 20 MG tablet, Take 20 mg by mouth every morning., Disp: , Rfl:   No Known  Allergies  History  Substance Use Topics  . Smoking status: Never Smoker   . Smokeless tobacco: Never Used  . Alcohol Use: Yes     Comment: occasionally    Family History  Problem Relation Age of Onset  . Cancer Sister     Ovarian  . Cancer Mother     Breast  . Diabetes Sister   . Irritable bowel syndrome Sister   . Esophageal cancer Neg Hx   . Stomach cancer Neg Hx      Review of Systems  Constitutional: Negative.   HENT: Negative for congestion, ear discharge, ear pain, hearing loss, nosebleeds, sore throat and tinnitus.   Eyes: Negative.   Respiratory: Negative.  Negative for stridor.   Cardiovascular: Negative.   Gastrointestinal: Negative.   Genitourinary: Negative.   Musculoskeletal: Positive for joint pain. Negative for myalgias, back pain, falls and neck pain.       Left knee pain  Skin: Negative.   Neurological: Positive for headaches. Negative for dizziness, tingling, tremors, sensory change, speech change, focal weakness, seizures and loss of consciousness.  Endo/Heme/Allergies: Negative.   Psychiatric/Behavioral: Negative.     Objective:  Physical Exam  Constitutional: She is oriented to person, place, and time. She appears well-developed and well-nourished. No distress.  HENT:  Head: Normocephalic and atraumatic.  Right Ear: External ear normal.  Left Ear: External ear normal.  Nose: Nose normal.  Eyes: Conjunctivae and EOM are normal.  Neck:  Normal range of motion. Neck supple.  Cardiovascular: Normal rate, regular rhythm, normal heart sounds and intact distal pulses.   No murmur heard. Respiratory: Effort normal and breath sounds normal. No respiratory distress. She has no wheezes.  GI: Soft. Bowel sounds are normal. She exhibits no distension. There is no tenderness.  Musculoskeletal:       Right hip: Normal.       Left hip: Normal.       Right knee: She exhibits decreased range of motion and swelling. She exhibits no effusion and no erythema.  Tenderness found. Medial joint line and lateral joint line tenderness noted.       Left knee: She exhibits decreased range of motion and swelling. She exhibits no effusion and no erythema. Tenderness found. Medial joint line and lateral joint line tenderness noted.  On exam of the right knee, she lacks about 5 degrees of full extension with flexion back to 120 degrees. She has marked crepitus noted, slight varus mal-alignment. She is tender over the medial joint line. Knee is stable to varus and valgus stressing.  Left knee ROM 10-120. Moderate crepitus is noted. She is tender over the medial joint line. No effusions are appreciated in either knee.  Neurological: She is alert and oriented to person, place, and time. She has normal strength and normal reflexes. No sensory deficit.  Skin: No rash noted. She is not diaphoretic. No erythema.  Psychiatric: She has a normal mood and affect. Her behavior is normal.    Vitals  Weight: 157 lb Height: 65in Body Surface Area: 1.78 m Body Mass Index: 26.13 kg/m  Pulse: 72 (Regular)  BP: 118/76 (Sitting, Left Arm, Standard)  Imaging Review Plain radiographs demonstrate severe degenerative joint disease of the left knee(s). The overall alignment ismild varus. The bone quality appears to be good for age and reported activity level.  Assessment/Plan:  End stage primary osteoarthritis, left knee   The patient history, physical examination, clinical judgment of the provider and imaging studies are consistent with end stage degenerative joint disease of the left knee(s) and total knee arthroplasty is deemed medically necessary. The treatment options including medical management, injection therapy arthroscopy and arthroplasty were discussed at length. The risks and benefits of total knee arthroplasty were presented and reviewed. The risks due to aseptic loosening, infection, stiffness, patella tracking problems, thromboembolic complications and  other imponderables were discussed. The patient acknowledged the explanation, agreed to proceed with the plan and consent was signed. Patient is being admitted for inpatient treatment for surgery, pain control, PT, OT, prophylactic antibiotics, VTE prophylaxis, progressive ambulation and ADL's and discharge planning. The patient is planning to be discharged home with home health services   PCP: Dr. Parke Simmers, PA-C

## 2014-06-01 NOTE — Patient Instructions (Addendum)
YOUR PROCEDURE IS SCHEDULED ON :  06/11/14  REPORT TO [facility] MAIN ENTRANCE FOLLOW SIGNS TO SHORT STAY CENTER AT :  5:15 AM  CALL THIS NUMBER IF YOU HAVE PROBLEMS THE MORNING OF SURGERY [phone]  REMEMBER:  DO NOT EAT FOOD OR DRINK LIQUIDS AFTER MIDNIGHT  TAKE THESE MEDICINES THE MORNING OF SURGERY: LEVOTHYROXINE / PAXIL  YOU MAY NOT HAVE ANY METAL ON YOUR BODY INCLUDING HAIR PINS AND PIERCING'S. DO NOT WEAR JEWELRY, MAKEUP, LOTIONS, POWDERS OR PERFUMES. DO NOT WEAR NAIL POLISH. DO NOT SHAVE 48 HRS PRIOR TO SURGERY. MEN MAY SHAVE FACE AND NECK.  DO NOT [place]. [facility] IS NOT RESPONSIBLE FOR VALUABLES.  CONTACTS, DENTURES OR PARTIALS MAY NOT BE WORN TO SURGERY. LEAVE SUITCASE IN CAR. CAN BE BROUGHT TO ROOM AFTER SURGERY.  PATIENTS DISCHARGED THE DAY OF SURGERY WILL NOT BE ALLOWED TO DRIVE HOME.  PLEASE READ OVER THE FOLLOWING INSTRUCTION SHEETS _________________________________________________________________________________                                           - PREPARING FOR SURGERY  Before surgery, you can play an important role.  Because skin is not sterile, your skin needs to be as free of germs as possible.  You can reduce the number of germs on your skin by washing with CHG (chlorahexidine gluconate) soap before surgery.  CHG is an antiseptic cleaner which kills germs and bonds with the skin to continue killing germs even after washing. Please DO NOT use if you have an allergy to CHG or antibacterial soaps.  If your skin becomes reddened/irritated stop using the CHG and inform your nurse when you arrive at Short Stay. Do not shave (including legs and underarms) for at least 48 hours prior to the first CHG shower.  You may shave your face. Please follow these instructions carefully:   1.  Shower with CHG Soap the night before surgery and the  morning of Surgery.   2.  If you choose to wash your hair, wash your  hair first as usual with your  normal  Shampoo.   3.  After you shampoo, rinse your hair and body thoroughly to remove the  shampoo.                                         4.  Use CHG as you would any other liquid soap.  You can apply chg directly  to the skin and wash . Gently wash with scrungie or clean wascloth    5.  Apply the CHG Soap to your body ONLY FROM THE NECK DOWN.   Do not use on open                           Wound or open sores. Avoid contact with eyes, ears mouth and genitals (private parts).                        Genitals (private parts) with your normal soap.              6.  Wash thoroughly, paying special attention to the area where your surgery  will be performed.  7.  Thoroughly rinse your body with warm water from the neck down.   8.  DO NOT shower/wash with your normal soap after using and rinsing off  the CHG Soap .                9.  Pat yourself dry with a clean towel.             10.  Wear clean night clothes to bed after shower             11.  Place clean sheets on your bed the night of your first shower and do not  sleep with pets.  Day of Surgery : Do not apply any lotions/deodorants the morning of surgery.  Please wear clean clothes to the hospital/surgery center.  FAILURE TO FOLLOW THESE INSTRUCTIONS MAY RESULT IN THE CANCELLATION OF YOUR SURGERY    PATIENT SIGNATURE_________________________________  ______________________________________________________________________     [name]  An incentive spirometer is a tool that can help keep your lungs clear and active. This tool measures how well you are filling your lungs with each breath. Taking long deep breaths may help reverse or decrease the chance of developing breathing (pulmonary) problems (especially infection) following:  A long period of time when you are unable to move or be active. BEFORE THE PROCEDURE   If the spirometer includes an indicator to show your best  effort, your nurse or respiratory therapist will set it to a desired goal.  If possible, sit up straight or lean slightly forward. Try not to slouch.  Hold the incentive spirometer in an upright position. INSTRUCTIONS FOR USE   Sit on the edge of your bed if possible, or sit up as far as you can in bed or on a chair.  Hold the incentive spirometer in an upright position.  Breathe out normally.  Place the mouthpiece in your mouth and seal your lips tightly around it.  Breathe in slowly and as deeply as possible, raising the piston or the ball toward the top of the column.  Hold your breath for 3-5 seconds or for as long as possible. Allow the piston or ball to fall to the bottom of the column.  Remove the mouthpiece from your mouth and breathe out normally.  Rest for a few seconds and repeat Steps 1 through 7 at least 10 times every 1-2 hours when you are awake. Take your time and take a few normal breaths between deep breaths.  The spirometer may include an indicator to show your best effort. Use the indicator as a goal to work toward during each repetition.  After each set of 10 deep breaths, practice coughing to be sure your lungs are clear. If you have an incision (the cut made at the time of surgery), support your incision when coughing by placing a pillow or rolled up towels firmly against it. Once you are able to get out of bed, walk around indoors and cough well. You may stop using the incentive spirometer when instructed by your caregiver.  RISKS AND COMPLICATIONS  Take your time so you do not get dizzy or light-headed.  If you are in pain, you may need to take or ask for pain medication before doing incentive spirometry. It is harder to take a deep breath if you are having pain. AFTER USE  Rest and breathe slowly and easily.  It can be helpful to keep track of a log of your progress. Your caregiver can provide you  with a simple table to help with this. If you are using  the spirometer at home, follow these instructions: [place] IF:   You are having difficultly using the spirometer.  You have trouble using the spirometer as often as instructed.  Your pain medication is not giving enough relief while using the spirometer.  You develop fever of 100.5 F (38.1 C) or higher. SEEK IMMEDIATE MEDICAL CARE IF:   You cough up bloody sputum that had not been present before.  You develop fever of 102 F (38.9 C) or greater.  You develop worsening pain at or near the incision site. MAKE SURE YOU:   Understand these instructions.  Will watch your condition.  Will get help right away if you are not doing well or get worse. Document Released: 06/08/2006 Document Revised: 04/20/2011 Document Reviewed: 08/09/2006 Exit[employer] Patient Information 2014 [employer], [place].   ________________________________________________________________________  WHAT IS A BLOOD TRANSFUSION? Blood Transfusion Information  A transfusion is the replacement of blood or some of its parts. Blood is made up of multiple cells which provide different functions.  Red blood cells carry oxygen and are used for blood loss replacement.  White blood cells fight against infection.  Platelets control bleeding.  Plasma helps clot blood.  Other blood products are available for specialized needs, such as hemophilia or other clotting disorders. BEFORE THE TRANSFUSION  Who gives blood for transfusions?   Healthy volunteers who are fully evaluated to make sure their blood is safe. This is blood bank blood. Transfusion therapy is the safest it has ever been in the practice of medicine. Before blood is taken from a donor, a complete history is taken to make sure that person has no history of diseases nor engages in risky social behavior (examples are intravenous drug use or sexual activity with multiple partners). The donor's travel history is screened to minimize risk of transmitting  infections, such as malaria. The donated blood is tested for signs of infectious diseases, such as HIV and hepatitis. The blood is then tested to be sure it is compatible with you in order to minimize the chance of a transfusion reaction. If you or a relative donates blood, this is often done in anticipation of surgery and is not appropriate for emergency situations. It takes many days to process the donated blood. RISKS AND COMPLICATIONS Although transfusion therapy is very safe and saves many lives, the main dangers of transfusion include:   Getting an infectious disease.  Developing a transfusion reaction. This is an allergic reaction to something in the blood you were given. Every precaution is taken to prevent this. The decision to have a blood transfusion has been considered carefully by your caregiver before blood is given. Blood is not given unless the benefits outweigh the risks. AFTER THE TRANSFUSION  Right after receiving a blood transfusion, you will usually feel much better and more energetic. This is especially true if your red blood cells have gotten low (anemic). The transfusion raises the level of the red blood cells which carry oxygen, and this usually causes an energy increase.  The nurse administering the transfusion will monitor you carefully for complications. HOME CARE INSTRUCTIONS  No special instructions are needed after a transfusion. You may find your energy is better. Speak with your caregiver about any limitations on activity for underlying diseases you may have. SEEK MEDICAL CARE IF:   Your condition is not improving after your transfusion.  You develop redness or irritation at the intravenous (IV) site.  SEEK IMMEDIATE MEDICAL CARE IF:  Any of the following symptoms occur over the next 12 hours:  Shaking chills.  You have a temperature by mouth above 102 F (38.9 C), not controlled by medicine.  Chest, back, or muscle pain.  People around you feel you are  not acting correctly or are confused.  Shortness of breath or difficulty breathing.  Dizziness and fainting.  You get a rash or develop hives.  You have a decrease in urine output.  Your urine turns a dark color or changes to pink, red, or brown. Any of the following symptoms occur over the next 10 days:  You have a temperature by mouth above 102 F (38.9 C), not controlled by medicine.  Shortness of breath.  Weakness after normal activity.  The white part of the eye turns yellow (jaundice).  You have a decrease in the amount of urine or are urinating less often.  Your urine turns a dark color or changes to pink, red, or brown. Document Released: 01/24/2000 Document Revised: 04/20/2011 Document Reviewed: 09/12/2007 [facility] Patient Information 2014 [place], [place].  _______________________________________________________________________

## 2014-06-04 ENCOUNTER — Encounter (HOSPITAL_COMMUNITY)
Admission: RE | Admit: 2014-06-04 | Discharge: 2014-06-04 | Disposition: A | Discharge status: Home / Self Care | Payer: Medicare Other | Source: Ambulatory Visit | Attending: Orthopedic Surgery | Admitting: Orthopedic Surgery

## 2014-06-04 ENCOUNTER — Encounter (HOSPITAL_COMMUNITY): Payer: Self-pay

## 2014-06-04 DIAGNOSIS — Z01810 Encounter for preprocedural cardiovascular examination: Secondary | ICD-10-CM | POA: Diagnosis not present

## 2014-06-04 DIAGNOSIS — Z01812 Encounter for preprocedural laboratory examination: Secondary | ICD-10-CM | POA: Diagnosis not present

## 2014-06-04 DIAGNOSIS — I10 Essential (primary) hypertension: Secondary | ICD-10-CM | POA: Diagnosis not present

## 2014-06-04 HISTORY — DX: Unspecified osteoarthritis, unspecified site: M19.90

## 2014-06-04 LAB — URINE MICROSCOPIC-ADD ON

## 2014-06-04 LAB — COMPREHENSIVE METABOLIC PANEL
ALT: 16 U/L (ref 0–35)
AST: 21 U/L (ref 0–37)
Albumin: 4.4 g/dL (ref 3.5–5.2)
Alkaline Phosphatase: 63 U/L (ref 39–117)
Anion gap: 6 (ref 5–15)
BILIRUBIN TOTAL: 0.7 mg/dL (ref 0.3–1.2)
BUN: 13 mg/dL (ref 6–23)
CALCIUM: 9.2 mg/dL (ref 8.4–10.5)
CO2: 29 mmol/L (ref 19–32)
Chloride: 107 mmol/L (ref 96–112)
Creatinine, Ser: 0.85 mg/dL (ref 0.50–1.10)
GFR calc non Af Amer: 70 mL/min — ABNORMAL LOW (ref >90)
GFR, EST AFRICAN AMERICAN: 82 mL/min — AB (ref >90)
Glucose, Bld: 88 mg/dL (ref 70–99)
Potassium: 3.7 mmol/L (ref 3.5–5.1)
Sodium: 142 mmol/L (ref 135–145)
TOTAL PROTEIN: 7.1 g/dL (ref 6.0–8.3)

## 2014-06-04 LAB — URINALYSIS, ROUTINE W REFLEX MICROSCOPIC
Bilirubin Urine: NEGATIVE
GLUCOSE, UA: NEGATIVE mg/dL
Ketones, ur: NEGATIVE mg/dL
Leukocytes, UA: NEGATIVE
NITRITE: NEGATIVE
Protein, ur: NEGATIVE mg/dL
SPECIFIC GRAVITY, URINE: 1.021 (ref 1.005–1.030)
Urobilinogen, UA: 1 mg/dL (ref 0.0–1.0)
pH: 5.5 (ref 5.0–8.0)

## 2014-06-04 LAB — PROTIME-INR
INR: 1 (ref 0.00–1.49)
PROTHROMBIN TIME: 13.3 s (ref 11.6–15.2)

## 2014-06-04 LAB — CBC
HCT: 41.1 % (ref 36.0–46.0)
Hemoglobin: 13.4 g/dL (ref 12.0–15.0)
MCH: 30 pg (ref 26.0–34.0)
MCHC: 32.6 g/dL (ref 30.0–36.0)
MCV: 92.2 fL (ref 78.0–100.0)
PLATELETS: 279 10*3/uL (ref 150–400)
RBC: 4.46 MIL/uL (ref 3.87–5.11)
RDW: 13.9 % (ref 11.5–15.5)
WBC: 7.6 10*3/uL (ref 4.0–10.5)

## 2014-06-04 LAB — SURGICAL PCR SCREEN
MRSA, PCR: NEGATIVE
STAPHYLOCOCCUS AUREUS: POSITIVE — AB

## 2014-06-04 LAB — ABO/RH: ABO/RH(D): O POS

## 2014-06-04 LAB — APTT: aPTT: 31 seconds (ref 24–37)

## 2014-06-04 NOTE — Progress Notes (Signed)
   06/04/14 1045  OBSTRUCTIVE SLEEP APNEA  Have you ever been diagnosed with sleep apnea through a sleep study? No  Do you snore loudly (loud enough to be heard through closed doors)?  1  Do you often feel tired, fatigued, or sleepy during the daytime? 1  Has anyone observed you stop breathing during your sleep? 1 (HAS WAKENED WITH "GASPING SENSATION")  Do you have, or are you being treated for high blood pressure? 1  BMI more than 35 kg/m2? 0  Age over 75 years old? 1  Neck circumference greater than 40 cm/16 inches? 0  Gender: 0

## 2014-06-11 ENCOUNTER — Inpatient Hospital Stay (HOSPITAL_COMMUNITY): Payer: Medicare Other | Admitting: Anesthesiology

## 2014-06-11 ENCOUNTER — Inpatient Hospital Stay (HOSPITAL_COMMUNITY)
Admission: RE | Admit: 2014-06-11 | Discharge: 2014-06-13 | DRG: 470 | Disposition: A | Discharge status: Home Health | Payer: Medicare Other | Source: Ambulatory Visit | Attending: Orthopedic Surgery | Admitting: Orthopedic Surgery

## 2014-06-11 ENCOUNTER — Encounter (HOSPITAL_COMMUNITY): Payer: Self-pay | Admitting: *Deleted

## 2014-06-11 ENCOUNTER — Encounter (HOSPITAL_COMMUNITY)
Admission: RE | Disposition: A | Discharge status: Home Health | Payer: Self-pay | Source: Ambulatory Visit | Attending: Orthopedic Surgery

## 2014-06-11 DIAGNOSIS — M179 Osteoarthritis of knee, unspecified: Secondary | ICD-10-CM | POA: Diagnosis present

## 2014-06-11 DIAGNOSIS — I10 Essential (primary) hypertension: Secondary | ICD-10-CM | POA: Diagnosis present

## 2014-06-11 DIAGNOSIS — E039 Hypothyroidism, unspecified: Secondary | ICD-10-CM | POA: Diagnosis present

## 2014-06-11 DIAGNOSIS — M1712 Unilateral primary osteoarthritis, left knee: Principal | ICD-10-CM | POA: Diagnosis present

## 2014-06-11 DIAGNOSIS — Z01812 Encounter for preprocedural laboratory examination: Secondary | ICD-10-CM

## 2014-06-11 DIAGNOSIS — Z79899 Other long term (current) drug therapy: Secondary | ICD-10-CM

## 2014-06-11 DIAGNOSIS — M25562 Pain in left knee: Secondary | ICD-10-CM | POA: Diagnosis present

## 2014-06-11 DIAGNOSIS — M1710 Unilateral primary osteoarthritis, unspecified knee: Secondary | ICD-10-CM | POA: Diagnosis present

## 2014-06-11 HISTORY — PX: TOTAL KNEE ARTHROPLASTY: SHX125

## 2014-06-11 LAB — TYPE AND SCREEN
ABO/RH(D): O POS
ANTIBODY SCREEN: NEGATIVE

## 2014-06-11 SURGERY — ARTHROPLASTY, KNEE, TOTAL
Anesthesia: Spinal | Site: Knee | Laterality: Left

## 2014-06-11 MED ORDER — CEFAZOLIN SODIUM-DEXTROSE 2-3 GM-% IV SOLR
INTRAVENOUS | Status: AC
  Filled 2014-06-11: qty 50

## 2014-06-11 MED ORDER — BUPIVACAINE HCL (PF) 0.25 % IJ SOLN
INTRAMUSCULAR | Status: AC
  Filled 2014-06-11: qty 30

## 2014-06-11 MED ORDER — MORPHINE SULFATE 2 MG/ML IJ SOLN
1.0000 mg | INTRAMUSCULAR | Status: DC | PRN
  Administered 2014-06-11 (×3): 2 mg via INTRAVENOUS
  Filled 2014-06-11 (×3): qty 1

## 2014-06-11 MED ORDER — LACTATED RINGERS IV SOLN
INTRAVENOUS | Status: DC | PRN
  Administered 2014-06-11 (×2): via INTRAVENOUS

## 2014-06-11 MED ORDER — ACETAMINOPHEN 10 MG/ML IV SOLN
1000.0000 mg | Freq: Once | INTRAVENOUS | Status: AC
  Administered 2014-06-11: 1000 mg via INTRAVENOUS
  Filled 2014-06-11: qty 100

## 2014-06-11 MED ORDER — TRANEXAMIC ACID 1000 MG/10ML IV SOLN
1000.0000 mg | INTRAVENOUS | Status: AC
  Administered 2014-06-11: 1000 mg via INTRAVENOUS
  Filled 2014-06-11: qty 10

## 2014-06-11 MED ORDER — KETOROLAC TROMETHAMINE 15 MG/ML IJ SOLN
7.5000 mg | Freq: Four times a day (QID) | INTRAMUSCULAR | Status: AC | PRN
Start: 2014-06-11 — End: 2014-06-12

## 2014-06-11 MED ORDER — CEFAZOLIN SODIUM-DEXTROSE 2-3 GM-% IV SOLR
2.0000 g | INTRAVENOUS | Status: AC
  Administered 2014-06-11: 2 g via INTRAVENOUS

## 2014-06-11 MED ORDER — PROPOFOL INFUSION 10 MG/ML OPTIME
INTRAVENOUS | Status: DC | PRN
  Administered 2014-06-11: 60 ug/kg/min via INTRAVENOUS

## 2014-06-11 MED ORDER — BUPIVACAINE LIPOSOME 1.3 % IJ SUSP
20.0000 mL | Freq: Once | INTRAMUSCULAR | Status: DC
  Filled 2014-06-11: qty 20

## 2014-06-11 MED ORDER — ACETAMINOPHEN 325 MG PO TABS: 650.0000 mg | ORAL_TABLET | Freq: Four times a day (QID) | ORAL | Status: DC | PRN

## 2014-06-11 MED ORDER — CEFAZOLIN SODIUM-DEXTROSE 2-3 GM-% IV SOLR
2.0000 g | Freq: Four times a day (QID) | INTRAVENOUS | Status: AC
Start: 2014-06-11 — End: 2014-06-11
  Administered 2014-06-11 (×2): 2 g via INTRAVENOUS
  Filled 2014-06-11 (×2): qty 50

## 2014-06-11 MED ORDER — ONDANSETRON HCL 4 MG/2ML IJ SOLN
4.0000 mg | Freq: Four times a day (QID) | INTRAMUSCULAR | Status: DC | PRN
  Administered 2014-06-11: 4 mg via INTRAVENOUS
  Filled 2014-06-11: qty 2

## 2014-06-11 MED ORDER — OXYCODONE HCL 5 MG PO TABS
5.0000 mg | ORAL_TABLET | ORAL | Status: DC | PRN
  Administered 2014-06-11 (×2): 10 mg via ORAL
  Administered 2014-06-12: 5 mg via ORAL
  Administered 2014-06-12 – 2014-06-13 (×7): 10 mg via ORAL
  Filled 2014-06-11 (×4): qty 2
  Filled 2014-06-11: qty 1
  Filled 2014-06-11 (×5): qty 2

## 2014-06-11 MED ORDER — DEXAMETHASONE SODIUM PHOSPHATE 10 MG/ML IJ SOLN
10.0000 mg | Freq: Once | INTRAMUSCULAR | Status: AC
  Administered 2014-06-12: 10 mg via INTRAVENOUS
  Filled 2014-06-11 (×2): qty 1

## 2014-06-11 MED ORDER — BISACODYL 10 MG RE SUPP: 10.0000 mg | Freq: Every day | RECTAL | Status: DC | PRN

## 2014-06-11 MED ORDER — PAROXETINE HCL 20 MG PO TABS
20.0000 mg | ORAL_TABLET | Freq: Every day | ORAL | Status: DC
  Administered 2014-06-12 – 2014-06-13 (×2): 20 mg via ORAL
  Filled 2014-06-11 (×3): qty 1

## 2014-06-11 MED ORDER — METOCLOPRAMIDE HCL 10 MG PO TABS
5.0000 mg | ORAL_TABLET | Freq: Three times a day (TID) | ORAL | Status: DC | PRN
Start: 2014-06-11 — End: 2014-06-13

## 2014-06-11 MED ORDER — LEVOTHYROXINE SODIUM 75 MCG PO TABS
75.0000 ug | ORAL_TABLET | Freq: Every day | ORAL | Status: DC
  Administered 2014-06-12 – 2014-06-13 (×2): 75 ug via ORAL
  Filled 2014-06-11 (×3): qty 1

## 2014-06-11 MED ORDER — FLEET ENEMA 7-19 GM/118ML RE ENEM: 1.0000 | ENEMA | Freq: Once | RECTAL | Status: AC | PRN

## 2014-06-11 MED ORDER — ONDANSETRON HCL 4 MG/2ML IJ SOLN
INTRAMUSCULAR | Status: DC | PRN
  Administered 2014-06-11: 4 mg via INTRAVENOUS

## 2014-06-11 MED ORDER — BUPIVACAINE LIPOSOME 1.3 % IJ SUSP
INTRAMUSCULAR | Status: DC | PRN
  Administered 2014-06-11: 20 mL

## 2014-06-11 MED ORDER — MIDAZOLAM HCL 5 MG/5ML IJ SOLN
INTRAMUSCULAR | Status: DC | PRN
  Administered 2014-06-11: 2 mg via INTRAVENOUS

## 2014-06-11 MED ORDER — DIPHENHYDRAMINE HCL 12.5 MG/5ML PO ELIX
12.5000 mg | ORAL_SOLUTION | ORAL | Status: DC | PRN
Start: 2014-06-11 — End: 2014-06-13

## 2014-06-11 MED ORDER — METHOCARBAMOL 1000 MG/10ML IJ SOLN
500.0000 mg | Freq: Four times a day (QID) | INTRAVENOUS | Status: DC | PRN
  Administered 2014-06-11 (×2): 500 mg via INTRAVENOUS
  Filled 2014-06-11 (×4): qty 5

## 2014-06-11 MED ORDER — DOCUSATE SODIUM 100 MG PO CAPS
100.0000 mg | ORAL_CAPSULE | Freq: Two times a day (BID) | ORAL | Status: DC
  Administered 2014-06-11 – 2014-06-13 (×5): 100 mg via ORAL

## 2014-06-11 MED ORDER — PROPOFOL 10 MG/ML IV BOLUS
INTRAVENOUS | Status: AC
  Filled 2014-06-11: qty 20

## 2014-06-11 MED ORDER — RIVAROXABAN 10 MG PO TABS
10.0000 mg | ORAL_TABLET | Freq: Every day | ORAL | Status: DC
  Administered 2014-06-12 – 2014-06-13 (×2): 10 mg via ORAL
  Filled 2014-06-11 (×3): qty 1

## 2014-06-11 MED ORDER — SODIUM CHLORIDE 0.9 % IV SOLN: INTRAVENOUS | Status: DC

## 2014-06-11 MED ORDER — SODIUM CHLORIDE 0.9 % IJ SOLN
INTRAMUSCULAR | Status: DC | PRN
  Administered 2014-06-11: 30 mL

## 2014-06-11 MED ORDER — CHLORHEXIDINE GLUCONATE 4 % EX LIQD: 60.0000 mL | Freq: Once | CUTANEOUS | Status: DC

## 2014-06-11 MED ORDER — SODIUM CHLORIDE 0.9 % IR SOLN
Status: DC | PRN
  Administered 2014-06-11: 1000 mL

## 2014-06-11 MED ORDER — ONDANSETRON HCL 4 MG/2ML IJ SOLN: 4.0000 mg | Freq: Once | INTRAMUSCULAR | Status: DC | PRN

## 2014-06-11 MED ORDER — ACETAMINOPHEN 650 MG RE SUPP: 650.0000 mg | Freq: Four times a day (QID) | RECTAL | Status: DC | PRN

## 2014-06-11 MED ORDER — POLYETHYLENE GLYCOL 3350 17 G PO PACK: 17.0000 g | PACK | Freq: Every day | ORAL | Status: DC | PRN

## 2014-06-11 MED ORDER — ACETAMINOPHEN 500 MG PO TABS
1000.0000 mg | ORAL_TABLET | Freq: Four times a day (QID) | ORAL | Status: AC
Start: 2014-06-11 — End: 2014-06-12
  Administered 2014-06-11 – 2014-06-12 (×4): 1000 mg via ORAL
  Filled 2014-06-11 (×5): qty 2

## 2014-06-11 MED ORDER — LOSARTAN POTASSIUM 50 MG PO TABS
100.0000 mg | ORAL_TABLET | Freq: Every morning | ORAL | Status: DC
  Filled 2014-06-11 (×2): qty 2

## 2014-06-11 MED ORDER — MIDAZOLAM HCL 2 MG/2ML IJ SOLN
INTRAMUSCULAR | Status: AC
  Filled 2014-06-11: qty 2

## 2014-06-11 MED ORDER — METHOCARBAMOL 500 MG PO TABS
500.0000 mg | ORAL_TABLET | Freq: Four times a day (QID) | ORAL | Status: DC | PRN
  Administered 2014-06-12 – 2014-06-13 (×2): 500 mg via ORAL
  Filled 2014-06-11 (×2): qty 1

## 2014-06-11 MED ORDER — KCL IN DEXTROSE-NACL 20-5-0.45 MEQ/L-%-% IV SOLN
INTRAVENOUS | Status: DC
  Administered 2014-06-11: 1000 mL via INTRAVENOUS
  Administered 2014-06-11: 23:00:00 via INTRAVENOUS
  Filled 2014-06-11 (×3): qty 1000

## 2014-06-11 MED ORDER — FENTANYL CITRATE (PF) 100 MCG/2ML IJ SOLN
INTRAMUSCULAR | Status: DC | PRN
  Administered 2014-06-11: 100 ug via INTRAVENOUS

## 2014-06-11 MED ORDER — TRAMADOL HCL 50 MG PO TABS: 50.0000 mg | ORAL_TABLET | Freq: Four times a day (QID) | ORAL | Status: DC | PRN

## 2014-06-11 MED ORDER — BUPIVACAINE HCL 0.25 % IJ SOLN
INTRAMUSCULAR | Status: DC | PRN
  Administered 2014-06-11: 20 mL

## 2014-06-11 MED ORDER — BUPIVACAINE HCL (PF) 0.75 % IJ SOLN
INTRAMUSCULAR | Status: DC | PRN
  Administered 2014-06-11: 15 mg

## 2014-06-11 MED ORDER — ONDANSETRON HCL 4 MG PO TABS: 4.0000 mg | ORAL_TABLET | Freq: Four times a day (QID) | ORAL | Status: DC | PRN

## 2014-06-11 MED ORDER — PHENOL 1.4 % MT LIQD
1.0000 | OROMUCOSAL | Status: DC | PRN
  Filled 2014-06-11: qty 177

## 2014-06-11 MED ORDER — FENTANYL CITRATE (PF) 100 MCG/2ML IJ SOLN
INTRAMUSCULAR | Status: AC
  Filled 2014-06-11: qty 2

## 2014-06-11 MED ORDER — METOCLOPRAMIDE HCL 5 MG/ML IJ SOLN: 5.0000 mg | Freq: Three times a day (TID) | INTRAMUSCULAR | Status: DC | PRN

## 2014-06-11 MED ORDER — SODIUM CHLORIDE 0.9 % IJ SOLN
INTRAMUSCULAR | Status: AC
  Filled 2014-06-11: qty 50

## 2014-06-11 MED ORDER — MENTHOL 3 MG MT LOZG: 1.0000 | LOZENGE | OROMUCOSAL | Status: DC | PRN

## 2014-06-11 MED ORDER — DEXAMETHASONE SODIUM PHOSPHATE 10 MG/ML IJ SOLN
10.0000 mg | Freq: Once | INTRAMUSCULAR | Status: AC
  Administered 2014-06-11: 10 mg via INTRAVENOUS

## 2014-06-11 SURGICAL SUPPLY — 62 items
BAG DECANTER FOR FLEXI CONT (MISCELLANEOUS) ×3 IMPLANT
BAG ZIPLOCK 12X15 (MISCELLANEOUS) ×3 IMPLANT
BANDAGE ELASTIC 6 VELCRO ST LF (GAUZE/BANDAGES/DRESSINGS) ×3 IMPLANT
BANDAGE ESMARK 6X9 LF (GAUZE/BANDAGES/DRESSINGS) ×1 IMPLANT
BLADE SAG 18X100X1.27 (BLADE) ×3 IMPLANT
BLADE SAW SGTL 11.0X1.19X90.0M (BLADE) ×3 IMPLANT
BNDG ESMARK 6X9 LF (GAUZE/BANDAGES/DRESSINGS) ×3
BOWL SMART MIX CTS (DISPOSABLE) ×3 IMPLANT
CAPT KNEE TOTAL 3 ATTUNE ×3 IMPLANT
CEMENT HV SMART SET (Cement) ×6 IMPLANT
CLOSURE WOUND 1/2 X4 (GAUZE/BANDAGES/DRESSINGS) ×1
CUFF TOURN SGL QUICK 34 (TOURNIQUET CUFF) ×2
CUFF TRNQT CYL 34X4X40X1 (TOURNIQUET CUFF) ×1 IMPLANT
DECANTER SPIKE VIAL GLASS SM (MISCELLANEOUS) ×3 IMPLANT
DRAPE EXTREMITY T 121X128X90 (DRAPE) ×3 IMPLANT
DRAPE POUCH INSTRU U-SHP 10X18 (DRAPES) ×3 IMPLANT
DRAPE U-SHAPE 47X51 STRL (DRAPES) ×3 IMPLANT
DRSG ADAPTIC 3X8 NADH LF (GAUZE/BANDAGES/DRESSINGS) ×3 IMPLANT
DRSG PAD ABDOMINAL 8X10 ST (GAUZE/BANDAGES/DRESSINGS) IMPLANT
DURAPREP 26ML APPLICATOR (WOUND CARE) ×3 IMPLANT
ELECT REM PT RETURN 9FT ADLT (ELECTROSURGICAL) ×3
ELECTRODE REM PT RTRN 9FT ADLT (ELECTROSURGICAL) ×1 IMPLANT
EVACUATOR 1/8 PVC DRAIN (DRAIN) ×3 IMPLANT
FACESHIELD WRAPAROUND (MASK) ×15 IMPLANT
GAUZE SPONGE 4X4 12PLY STRL (GAUZE/BANDAGES/DRESSINGS) ×3 IMPLANT
GLOVE BIO SURGEON STRL SZ7.5 (GLOVE) IMPLANT
GLOVE BIO SURGEON STRL SZ8 (GLOVE) ×3 IMPLANT
GLOVE BIOGEL PI IND STRL 6.5 (GLOVE) ×2 IMPLANT
GLOVE BIOGEL PI IND STRL 8 (GLOVE) ×1 IMPLANT
GLOVE BIOGEL PI INDICATOR 6.5 (GLOVE) ×4
GLOVE BIOGEL PI INDICATOR 8 (GLOVE) ×2
GLOVE SURG SS PI 6.5 STRL IVOR (GLOVE) ×3 IMPLANT
GOWN STRL REUS W/TWL LRG LVL3 (GOWN DISPOSABLE) ×6 IMPLANT
GOWN STRL REUS W/TWL XL LVL3 (GOWN DISPOSABLE) IMPLANT
HANDPIECE INTERPULSE COAX TIP (DISPOSABLE) ×2
IMMOBILIZER KNEE 20 (SOFTGOODS) ×3 IMPLANT
IMMOBILIZER KNEE 20 THIGH 36 (SOFTGOODS) IMPLANT
KIT BASIN OR (CUSTOM PROCEDURE TRAY) ×3 IMPLANT
MANIFOLD NEPTUNE II (INSTRUMENTS) ×3 IMPLANT
NDL SAFETY ECLIPSE 18X1.5 (NEEDLE) ×2 IMPLANT
NEEDLE HYPO 18GX1.5 SHARP (NEEDLE) ×4
NS IRRIG 1000ML POUR BTL (IV SOLUTION) ×3 IMPLANT
PACK TOTAL JOINT (CUSTOM PROCEDURE TRAY) ×3 IMPLANT
PAD ABD 8X10 STRL (GAUZE/BANDAGES/DRESSINGS) ×3 IMPLANT
PADDING CAST COTTON 6X4 STRL (CAST SUPPLIES) ×3 IMPLANT
PEN SKIN MARKING BROAD (MISCELLANEOUS) ×3 IMPLANT
POSITIONER SURGICAL ARM (MISCELLANEOUS) ×3 IMPLANT
SET HNDPC FAN SPRY TIP SCT (DISPOSABLE) ×1 IMPLANT
STRIP CLOSURE SKIN 1/2X4 (GAUZE/BANDAGES/DRESSINGS) ×2 IMPLANT
SUCTION FRAZIER 12FR DISP (SUCTIONS) ×3 IMPLANT
SUT MNCRL AB 4-0 PS2 18 (SUTURE) ×3 IMPLANT
SUT VIC AB 2-0 CT1 27 (SUTURE) ×6
SUT VIC AB 2-0 CT1 TAPERPNT 27 (SUTURE) ×3 IMPLANT
SUT VLOC 180 0 24IN GS25 (SUTURE) ×3 IMPLANT
SYR 20CC LL (SYRINGE) ×3 IMPLANT
SYR 50ML LL SCALE MARK (SYRINGE) ×3 IMPLANT
TOWEL OR 17X26 10 PK STRL BLUE (TOWEL DISPOSABLE) ×3 IMPLANT
TOWEL OR NON WOVEN STRL DISP B (DISPOSABLE) ×3 IMPLANT
TRAY FOLEY W/METER SILVER 14FR (SET/KITS/TRAYS/PACK) ×3 IMPLANT
WATER STERILE IRR 1500ML POUR (IV SOLUTION) ×3 IMPLANT
WRAP KNEE MAXI GEL POST OP (GAUZE/BANDAGES/DRESSINGS) ×3 IMPLANT
YANKAUER SUCT BULB TIP 10FT TU (MISCELLANEOUS) ×3 IMPLANT

## 2014-06-11 NOTE — Progress Notes (Signed)
Utilization review completed.  

## 2014-06-11 NOTE — Evaluation (Signed)
Physical Therapy Evaluation Patient Details Name: Sheryl Hamilton MRN: [id number] DOB: [birth year] Today's Date: 06/11/2014   History of Present Illness  s/p L TKA  Clinical Impression  Pt admitted with above diagnosis. Pt currently with functional limitations due to the deficits listed below (see PT Problem List).  Pt will benefit from skilled PT to increase their independence and safety with mobility to allow discharge to the venue listed below.  Plan is for home with HHPT, pt has DME     Follow Up Recommendations Home health PT    Equipment Recommendations  None recommended by PT    Recommendations for Other Services       Precautions / Restrictions Precautions Precautions: Knee Precaution Comments: I SLR today, KI not used Required Braces or Orthoses: Knee Immobilizer - Left Knee Immobilizer - Left: Discontinue once straight leg raise with < 10 degree lag Restrictions Other Position/Activity Restrictions: WBAT      Mobility  Bed Mobility Overal bed mobility: Needs Assistance Bed Mobility: Supine to Sit     Supine to sit: Min assist     General bed mobility comments: cues for  technique  Transfers Overall transfer level: Needs assistance Equipment used: Rolling walker (2 wheeled) Transfers: Sit to/from Stand Sit to Stand: Min assist         General transfer comment: cues for hand placement and LLE position  Ambulation/Gait Ambulation/Gait assistance: Min guard Ambulation Distance (Feet): 50 Feet Assistive device: Rolling walker (2 wheeled) Gait Pattern/deviations: Step-to pattern;Decreased weight shift to left     General Gait Details: cues for sequence, RW distance from self  Stairs            Wheelchair Mobility    Modified Rankin (Stroke Patients Only)       Balance                                             Pertinent Vitals/Pain Pain Assessment: 0-10 Pain Score: 4  Pain Location: L knee Pain Descriptors /  Indicators: Aching Pain Intervention(s): Monitored during session;Patient requesting pain meds-RN notified;Limited activity within patient's tolerance;Repositioned    Home Living Family/patient expects to be discharged to:: Private residence Living Arrangements: Spouse/significant other   Type of Home: House Home Access: Stairs to enter   [employer] of Steps: 1 Home Layout: One level Home Equipment: Environmental consultant - 2 wheels;Cane - single point;Bedside commode;Shower seat      Prior Function Level of Independence: Independent               Hand Dominance        Extremity/Trunk Assessment   Upper Extremity Assessment: Defer to OT evaluation           Lower Extremity Assessment: LLE deficits/detail   LLE Deficits / Details: able to do SLR today; ankle WFL, kne flexion ~35*     Communication   Communication: No difficulties  Cognition Arousal/Alertness: Awake/alert Behavior During Therapy: WFL for tasks assessed/performed Overall Cognitive Status: Within Functional Limits for tasks assessed                      General Comments      Exercises Total Joint Exercises Ankle Circles/Pumps: AROM;Both;10 reps Quad Sets: 10 reps;Both;AROM      Assessment/Plan    PT Assessment Patient needs continued PT services  PT Diagnosis Difficulty walking  PT Problem List Decreased strength;Decreased range of motion;Decreased mobility;Decreased knowledge of use of DME  PT Treatment Interventions DME instruction;Gait training;Functional mobility training;Therapeutic activities;Patient/family education;Therapeutic exercise   PT Goals (Current goals can be found in the Care Plan section) Acute Rehab PT Goals Patient Stated Goal: decr pain, back to I PT Goal Formulation: With patient Time For Goal Achievement: 06/14/14 Potential to Achieve Goals: Good    Frequency BID   Barriers to discharge        Co-evaluation               End of Session  Equipment Utilized During Treatment: Gait belt Activity Tolerance: Patient tolerated treatment well Patient left: in chair;with call bell/phone within reach;with family/visitor present Nurse Communication: Mobility status         Time: 2111-5520 PT Time Calculation (min) (ACUTE ONLY): 28 min   Charges:   PT Evaluation $Initial PT Evaluation Tier I: 1 Procedure PT Treatments $Gait Training: 8-22 mins   PT G Codes:        Brithany Whitworth 07-04-2014, 3:00 PM

## 2014-06-11 NOTE — Anesthesia Postprocedure Evaluation (Signed)
  Anesthesia Post-op Note  Patient: Sheryl Hamilton  Procedure(s) Performed: Procedure(s): LEFT TOTAL KNEE ARTHROPLASTY (Left)  Patient Location: PACU  Anesthesia Type:Spinal  Level of Consciousness: awake, alert , oriented and patient cooperative  Airway and Oxygen Therapy: Patient Spontanous Breathing  Post-op Pain: none  Post-op Assessment: Post-op Vital signs reviewed, Patient's Cardiovascular Status Stable, Respiratory Function Stable, Patent Airway, No signs of Nausea or vomiting and Pain level controlled  Post-op Vital Signs: stable  Last Vitals:  Filed Vitals:   06/11/14 0942  BP: 134/76  Pulse: 70  Temp: 36.5 C  Resp:     Complications: No apparent anesthesia complications

## 2014-06-11 NOTE — Progress Notes (Signed)
   06/11/14 1600  PT Visit Information  Last PT Received On 06/11/14  Assistance Needed +1  History of Present Illness s/p L TKA  PT Time Calculation  PT Start Time (ACUTE ONLY) 1558  PT Stop Time (ACUTE ONLY) 1608  PT Time Calculation (min) (ACUTE ONLY) 10 min  Subjective Data  Patient Stated Goal decr pain, back to I  Precautions  Precautions Knee  Restrictions  Other Position/Activity Restrictions WBAT  Pain Assessment  Pain Assessment 0-10  Pain Score 2  Pain Location L knee  Pain Descriptors / Indicators Aching  Pain Intervention(s) Limited activity within patient's tolerance;Monitored during session;Premedicated before session;Ice applied;Repositioned  Cognition  Arousal/Alertness Awake/alert  Behavior During Therapy WFL for tasks assessed/performed  Overall Cognitive Status Within Functional Limits for tasks assessed  Bed Mobility  Overal bed mobility Needs Assistance  Bed Mobility Sit to Supine  Sit to supine Min guard;Min assist  General bed mobility comments cues for  technique, assist with LLE  Transfers  Overall transfer level Needs assistance  Equipment used Rolling walker (2 wheeled)  Transfers Sit to/from [employer] Transfers  Sit to Stand Min guard  Stand pivot transfers Min guard  General transfer comment cues for hand placement and LLE position  Ambulation/Gait  Ambulation/Gait assistance Min guard  Ambulation Distance (Feet) 5 Feet  Assistive device Rolling walker (2 wheeled)  General Gait Details cues for sequence, RW distance from self  Gait Pattern/deviations Step-to pattern  PT - End of Session  Equipment Utilized During Treatment Gait belt  Activity Tolerance Patient tolerated treatment well  Patient left in bed;with call bell/phone within reach;with family/visitor present  Nurse Communication Mobility status  PT - Assessment/Plan  PT Plan Current plan remains appropriate  PT Frequency (ACUTE ONLY) BID  Follow Up Recommendations Home  health PT  PT equipment None recommended by PT  PT Goal Progression  Progress towards PT goals Progressing toward goals  Acute Rehab PT Goals  PT Goal Formulation With patient  Time For Goal Achievement 06/14/14  Potential to Achieve Goals Good  PT General Charges  $$ ACUTE PT VISIT 1 Procedure  PT Treatments  $Therapeutic Activity 8-22 mins

## 2014-06-11 NOTE — Transfer of Care (Signed)
Immediate Anesthesia Transfer of Care Note  Patient: Sheryl Hamilton  Procedure(s) Performed: Procedure(s): LEFT TOTAL KNEE ARTHROPLASTY (Left)  Patient Location: PACU  Anesthesia Type:Spinal  Level of Consciousness: sedated  Airway & Oxygen Therapy: Patient Spontanous Breathing and Patient connected to face mask oxygen  Post-op Assessment: Report given to RN and Post -op Vital signs reviewed and stable  Post vital signs: Reviewed and stable  Last Vitals:  Filed Vitals:   06/11/14 0509  BP: 144/79  Pulse: 82  Temp: 36.7 C  Resp: 18    Complications: No apparent anesthesia complications

## 2014-06-11 NOTE — Op Note (Signed)
Pre-operative diagnosis- Osteoarthritis  Left knee(s)  Post-operative diagnosis- Osteoarthritis Left knee(s)  Procedure-  Left  Total Knee Arthroplasty  Surgeon- Dione Plover. Rajveer Handler, MD  Assistant- Ardeen Jourdain, PA-C   Anesthesia-  Spinal  EBL-* No blood loss amount entered *   Drains Hemovac  Tourniquet time-  Total Tourniquet Time Documented: Thigh (Left) - 30 minutes Total: Thigh (Left) - 30 minutes     Complications- None  Condition-PACU - hemodynamically stable.   Brief Clinical Note  Sheryl Hamilton is a 64 y.o. year old female with end stage OA of her left knee with progressively worsening pain and dysfunction. She has constant pain, with activity and at rest and significant functional deficits with difficulties even with ADLs. She has had extensive non-op management including analgesics, injections of cortisone and viscosupplements, and home exercise program, but remains in significant pain with significant dysfunction. Radiographs show bone on bone arthritis medial and patellofemoral. She presents now for left Total Knee Arthroplasty.    Procedure in detail---   The patient is brought into the operating room and positioned supine on the operating table. After successful administration of  Spinal,   a tourniquet is placed high on the  Left thigh(s) and the lower extremity is prepped and draped in the usual sterile fashion. Time out is performed by the operating team and then the  Left lower extremity is wrapped in Esmarch, knee flexed and the tourniquet inflated to 300 mmHg.       A midline incision is made with a ten blade through the subcutaneous tissue to the level of the extensor mechanism. A fresh blade is used to make a medial parapatellar arthrotomy. Soft tissue over the proximal medial tibia is subperiosteally elevated to the joint line with a knife and into the semimembranosus bursa with a Cobb elevator. Soft tissue over the proximal lateral tibia is elevated with  attention being paid to avoiding the patellar tendon on the tibial tubercle. The patella is everted, knee flexed 90 degrees and the ACL and PCL are removed. Findings are bone on bone medial and patellofemoral with large global osteophytes.        The drill is used to create a starting hole in the distal femur and the canal is thoroughly irrigated with sterile saline to remove the fatty contents. The 5 degree Left  valgus alignment guide is placed into the femoral canal and the distal femoral cutting block is pinned to remove 9 mm off the distal femur. Resection is made with an oscillating saw.      The tibia is subluxed forward and the menisci are removed. The extramedullary alignment guide is placed referencing proximally at the medial aspect of the tibial tubercle and distally along the second metatarsal axis and tibial crest. The block is pinned to remove 60mm off the more deficient medial  side. Resection is made with an oscillating saw. Size 6is the most appropriate size for the tibia and the proximal tibia is prepared with the modular drill and keel punch for that size.      The femoral sizing guide is placed and size 6 is most appropriate. Rotation is marked off the epicondylar axis and confirmed by creating a rectangular flexion gap at 90 degrees. The size 6 cutting block is pinned in this rotation and the anterior, posterior and chamfer cuts are made with the oscillating saw. The intercondylar block is then placed and that cut is made.      Trial size 6 tibial component,  trial size 6 posterior stabilized femur and a 10  mm posterior stabilized rotating platform insert trial is placed. Full extension is achieved with excellent varus/valgus and anterior/posterior balance throughout full range of motion. The patella is everted and thickness measured to be 24  mm. Free hand resection is taken to 14 mm, a 35 template is placed, lug holes are drilled, trial patella is placed, and it tracks normally.  Osteophytes are removed off the posterior femur with the trial in place. All trials are removed and the cut bone surfaces prepared with pulsatile lavage. Cement is mixed and once ready for implantation, the size 6 tibial implant, size  6 posterior stabilized femoral component, and the size 35 patella are cemented in place and the patella is held with the clamp. The trial insert is placed and the knee held in full extension. The Exparel (20 ml mixed with 30 ml saline) and .25% Bupivicaine, are injected into the extensor mechanism, posterior capsule, medial and lateral gutters and subcutaneous tissues.  All extruded cement is removed and once the cement is hard the permanent 10 mm posterior stabilized rotating platform insert is placed into the tibial tray.      The wound is copiously irrigated with saline solution and the extensor mechanism closed over a hemovac drain with #1 V-loc suture. The tourniquet is released for a total tourniquet time of 30  minutes. Flexion against gravity is 140 degrees and the patella tracks normally. Subcutaneous tissue is closed with 2.0 vicryl and subcuticular with running 4.0 Monocryl. The incision is cleaned and dried and steri-strips and a bulky sterile dressing are applied. The limb is placed into a knee immobilizer and the patient is awakened and transported to recovery in stable condition.      Please note that a surgical assistant was a medical necessity for this procedure in order to perform it in a safe and expeditious manner. Surgical assistant was necessary to retract the ligaments and vital neurovascular structures to prevent injury to them and also necessary for proper positioning of the limb to allow for anatomic placement of the prosthesis.   Dione Plover Courteny Egler, MD    06/11/2014, 8:00 AM

## 2014-06-11 NOTE — Anesthesia Preprocedure Evaluation (Addendum)
Anesthesia Evaluation  Patient identified by MRN, date of birth, ID band Patient awake    Reviewed: Allergy & Precautions, NPO status , Patient's Chart, lab work & pertinent test results  Airway Mallampati: I       Dental   Pulmonary    Pulmonary exam normal       Cardiovascular hypertension, Rhythm:Regular Rate:Normal     Neuro/Psych    GI/Hepatic   Endo/Other  Hypothyroidism   Renal/GU      Musculoskeletal  (+) Arthritis -,   Abdominal   Peds  Hematology   Anesthesia Other Findings   Reproductive/Obstetrics                            Anesthesia Physical Anesthesia Plan  ASA: II  Anesthesia Plan: Spinal   Post-op Pain Management:    Induction: Intravenous  Airway Management Planned: Simple Face Mask  Additional Equipment:   Intra-op Plan:   Post-operative Plan:   Informed Consent: I have reviewed the patients History and Physical, chart, labs and discussed the procedure including the risks, benefits and alternatives for the proposed anesthesia with the patient or authorized representative who has indicated his/her understanding and acceptance.     Plan Discussed with: CRNA, Anesthesiologist and Surgeon  Anesthesia Plan Comments:        Anesthesia Quick Evaluation

## 2014-06-11 NOTE — Anesthesia Procedure Notes (Signed)
Spinal Patient location during procedure: OR Start time: 06/11/2014 7:05 AM End time: 06/11/2014 7:08 AM Staffing Resident/CRNA: Harle Stanford R Preanesthetic Checklist Completed: patient identified, site marked, surgical consent, pre-op evaluation, timeout performed, IV checked, risks and benefits discussed and monitors and equipment checked Spinal Block Patient position: sitting Prep: Betadine Patient monitoring: heart rate, cardiac monitor, continuous pulse ox and blood pressure Approach: midline Location: L3-4 Injection technique: single-shot Needle Needle type: Sprotte  Needle gauge: 24 G Needle length: 9 cm Needle insertion depth: 6 cm Assessment Sensory level: T6

## 2014-06-11 NOTE — Discharge Instructions (Addendum)
Dr. Gaynelle Arabian Total Joint Specialist [facility] [address] [place], [place] [address] [phone]  TOTAL KNEE REPLACEMENT POSTOPERATIVE DIRECTIONS  Knee Rehabilitation, Guidelines Following Surgery  Results after knee surgery are often greatly improved when you follow the exercise, range of motion and muscle strengthening exercises prescribed by your doctor. Safety measures are also important to protect the knee from further injury. Any time any of these exercises cause you to have increased pain or swelling in your knee joint, decrease the amount until you are comfortable again and slowly increase them. If you have problems or questions, call your caregiver or physical therapist for advice.   HOME CARE INSTRUCTIONS  Remove items at home which could result in a fall. This includes throw rugs or furniture in walking pathways.   ICE to the affected knee every three hours for 30 minutes at a time and then as needed for pain and swelling.  Continue to use ice on the knee for pain and swelling from surgery. You may notice swelling that will progress down to the foot and ankle.  This is normal after surgery.  Elevate the leg when you are not up walking on it.    Continue to use the breathing machine which will help keep your temperature down.  It is common for your temperature to cycle up and down following surgery, especially at night when you are not up moving around and exerting yourself.  The breathing machine keeps your lungs expanded and your temperature down.  Do not place pillow under knee, focus on keeping the knee straight while resting  DIET You may resume your previous home diet once your are discharged from the hospital.  DRESSING / WOUND CARE / SHOWERING You may change your dressing 3-5 days after surgery.  Then change the dressing every day with sterile gauze.  Please use good hand washing techniques before changing the dressing.  Do not use any  lotions or creams on the incision until instructed by your surgeon. You may start showering once you are discharged home but do not submerge the incision under water. Just pat the incision dry and apply a dry gauze dressing on daily. Change the surgical dressing daily and reapply a dry dressing each time.  ACTIVITY Walk with your walker as instructed. Use walker as long as suggested by your caregivers. Avoid periods of inactivity such as sitting longer than an hour when not asleep. This helps prevent blood clots.  You may resume a sexual relationship in one month or when given the OK by your doctor.  You may return to work once you are cleared by your doctor.  Do not drive a car for 6 weeks or until released by you surgeon.  Do not drive while taking narcotics.  WEIGHT BEARING Weight bearing as tolerated with assist device (walker, cane, etc) as directed, use it as long as suggested by your surgeon or therapist, typically at least 4-6 weeks.  POSTOPERATIVE CONSTIPATION PROTOCOL Constipation - defined medically as fewer than three stools per week and severe constipation as less than one stool per week.  One of the most common issues patients have following surgery is constipation.  Even if you have a regular bowel pattern at home, your normal regimen is likely to be disrupted due to multiple reasons following surgery.  Combination of anesthesia, postoperative narcotics, change in appetite and fluid intake all can affect your bowels.  In order to avoid complications following surgery, here are some recommendations  in order to help you during your recovery period.  Colace (docusate) - Pick up an over-the-counter form of Colace or another stool softener and take twice a day as long as you are requiring postoperative pain medications.  Take with a full glass of water daily.  If you experience loose stools or diarrhea, hold the colace until you stool forms back up.  If your symptoms do not get better  within 1 week or if they get worse, check with your doctor.  Dulcolax (bisacodyl) - Pick up over-the-counter and take as directed by the product packaging as needed to assist with the movement of your bowels.  Take with a full glass of water.  Use this product as needed if not relieved by Colace only.   MiraLax (polyethylene glycol) - Pick up over-the-counter to have on hand.  MiraLax is a solution that will increase the amount of water in your bowels to assist with bowel movements.  Take as directed and can mix with a glass of water, juice, soda, coffee, or tea.  Take if you go more than two days without a movement. Do not use MiraLax more than once per day. Call your doctor if you are still constipated or irregular after using this medication for 7 days in a row.  If you continue to have problems with postoperative constipation, please contact the office for further assistance and recommendations.  If you experience "the worst abdominal pain ever" or develop nausea or vomiting, please contact the office immediatly for further recommendations for treatment.  ITCHING  If you experience itching with your medications, try taking only a single pain pill, or even half a pain pill at a time.  You can also use Benadryl over the counter for itching or also to help with sleep.   TED HOSE STOCKINGS Wear the elastic stockings on both legs for three weeks following surgery during the day but you may remove then at night for sleeping.  MEDICATIONS See your medication summary on the After Visit Summary that the nursing staff will review with you prior to discharge.  You may have some home medications which will be placed on hold until you complete the course of blood thinner medication.  It is important for you to complete the blood thinner medication as prescribed by your surgeon.  Continue your approved medications as instructed at time of discharge.  PRECAUTIONS If you experience chest pain or shortness  of breath - call 911 immediately for transfer to the hospital emergency department.  If you develop a fever greater that 101 F, purulent drainage from wound, increased redness or drainage from wound, foul odor from the wound/dressing, or calf pain - CONTACT YOUR SURGEON.                                                   FOLLOW-UP APPOINTMENTS Make sure you keep all of your appointments after your operation with your surgeon and caregivers. You should call the office at the above phone number and make an appointment for approximately two weeks after the date of your surgery or on the date instructed by your surgeon outlined in the "After Visit Summary".   RANGE OF MOTION AND STRENGTHENING EXERCISES  Rehabilitation of the knee is important following a knee injury or an operation. After just a few days of immobilization, the muscles  of the thigh which control the knee become weakened and shrink (atrophy). Knee exercises are designed to build up the tone and strength of the thigh muscles and to improve knee motion. Often times heat used for twenty to thirty minutes before working out will loosen up your tissues and help with improving the range of motion but do not use heat for the first two weeks following surgery. These exercises can be done on a training (exercise) mat, on the floor, on a table or on a bed. Use what ever works the best and is most comfortable for you Knee exercises include:  Leg Lifts - While your knee is still immobilized in a splint or cast, you can do straight leg raises. Lift the leg to 60 degrees, hold for 3 sec, and slowly lower the leg. Repeat 10-20 times 2-3 times daily. Perform this exercise against resistance later as your knee gets better.  Quad and Hamstring Sets - Tighten up the muscle on the front of the thigh (Quad) and hold for 5-10 sec. Repeat this 10-20 times hourly. Hamstring sets are done by pushing the foot backward against an object and holding for 5-10 sec. Repeat as  with quad sets.   Leg Slides: Lying on your back, slowly slide your foot toward your buttocks, bending your knee up off the floor (only go as far as is comfortable). Then slowly slide your foot back down until your leg is flat on the floor again.  Angel Wings: Lying on your back spread your legs to the side as far apart as you can without causing discomfort.  A rehabilitation program following serious knee injuries can speed recovery and prevent re-injury in the future due to weakened muscles. Contact your doctor or a physical therapist for more information on knee rehabilitation.   IF YOU ARE TRANSFERRED TO A SKILLED REHAB FACILITY If the patient is transferred to a skilled rehab facility following release from the hospital, a list of the current medications will be sent to the facility for the patient to continue.  When discharged from the skilled rehab facility, please have the facility set up the patient's [place] prior to being released. Also, the skilled facility will be responsible for providing the patient with their medications at time of release from the facility to include their pain medication, the muscle relaxants, and their blood thinner medication. If the patient is still at the rehab facility at time of the two week follow up appointment, the skilled rehab facility will also need to assist the patient in arranging follow up appointment in our office and any transportation needs.  MAKE SURE YOU:  Understand these instructions.  Get help right away if you are not doing well or get worse.    Pick up stool softner and laxative for home use following surgery while on pain medications. Do not submerge incision under water. Please use good hand washing techniques while changing dressing each day. May shower starting three days after surgery. Please use a clean towel to pat the incision dry following showers. Continue to use ice for pain and swelling after  surgery. Do not use any lotions or creams on the incision until instructed by your surgeon.  Take Xarelto for two and a half more weeks, then discontinue Xarelto. Once the patient has completed the blood thinner regimen, then take a Baby 81 mg Aspirin daily for three more weeks.   Information on my medicine - XARELTO (Rivaroxaban)  This medication education was  reviewed with me or my healthcare representative as part of my discharge preparation.  The pharmacist that spoke with me during my hospital stay was:  Altha Harm  Why was Xarelto prescribed for you? Xarelto was prescribed for you to reduce the risk of blood clots forming after orthopedic surgery. The medical term for these abnormal blood clots is venous thromboembolism (VTE).  What do you need to know about xarelto ? Take your Xarelto ONCE DAILY at the same time every day. You may take it either with or without food.  If you have difficulty swallowing the tablet whole, you may crush it and mix in applesauce just prior to taking your dose.  Take Xarelto exactly as prescribed by your doctor and DO NOT stop taking Xarelto without talking to the doctor who prescribed the medication.  Stopping without other VTE prevention medication to take the place of Xarelto may increase your risk of developing a clot.  After discharge, you should have regular check-up appointments with your healthcare provider that is prescribing your Xarelto.    What do you do if you miss a dose? If you miss a dose, take it as soon as you remember on the same day then continue your regularly scheduled once daily regimen the next day. Do not take two doses of Xarelto on the same day.   Important Safety Information A possible side effect of Xarelto is bleeding. You should call your healthcare provider right away if you experience any of the following: ? Bleeding from an injury or your nose that does not stop. ? Unusual colored urine (red or dark brown) or  unusual colored stools (red or black). ? Unusual bruising for unknown reasons. ? A serious fall or if you hit your head (even if there is no bleeding).  Some medicines may interact with Xarelto and might increase your risk of bleeding while on Xarelto. To help avoid this, consult your healthcare provider or pharmacist prior to using any new prescription or non-prescription medications, including herbals, vitamins, non-steroidal anti-inflammatory drugs (NSAIDs) and supplements.  This website has more information on Xarelto: [email or web address]

## 2014-06-11 NOTE — Interval H&P Note (Signed)
History and Physical Interval Note:  06/11/2014 6:43 AM  Sheryl Hamilton  has presented today for surgery, with the diagnosis of LEFT KNEE OA  The various methods of treatment have been discussed with the patient and family. After consideration of risks, benefits and other options for treatment, the patient has consented to  Procedure(s): LEFT TOTAL KNEE ARTHROPLASTY (Left) as a surgical intervention .  The patient's history has been reviewed, patient examined, no change in status, stable for surgery.  I have reviewed the patient's chart and labs.  Questions were answered to the patient's satisfaction.     [name]

## 2014-06-12 ENCOUNTER — Encounter (HOSPITAL_COMMUNITY): Payer: Self-pay | Admitting: Orthopedic Surgery

## 2014-06-12 LAB — CBC
HCT: 32.7 % — ABNORMAL LOW (ref 36.0–46.0)
Hemoglobin: 10.5 g/dL — ABNORMAL LOW (ref 12.0–15.0)
MCH: 29.8 pg (ref 26.0–34.0)
MCHC: 32.1 g/dL (ref 30.0–36.0)
MCV: 92.9 fL (ref 78.0–100.0)
Platelets: 253 10*3/uL (ref 150–400)
RBC: 3.52 MIL/uL — ABNORMAL LOW (ref 3.87–5.11)
RDW: 14.4 % (ref 11.5–15.5)
WBC: 16.4 10*3/uL — AB (ref 4.0–10.5)

## 2014-06-12 LAB — BASIC METABOLIC PANEL
Anion gap: 4 — ABNORMAL LOW (ref 5–15)
BUN: 9 mg/dL (ref 6–20)
CALCIUM: 8.5 mg/dL — AB (ref 8.9–10.3)
CO2: 27 mmol/L (ref 22–32)
Chloride: 108 mmol/L (ref 101–111)
Creatinine, Ser: 0.99 mg/dL (ref 0.44–1.00)
GFR, EST NON AFRICAN AMERICAN: 59 mL/min — AB (ref >60)
Glucose, Bld: 164 mg/dL — ABNORMAL HIGH (ref 70–99)
Potassium: 4.4 mmol/L (ref 3.5–5.1)
Sodium: 139 mmol/L (ref 135–145)

## 2014-06-12 NOTE — Progress Notes (Signed)
Physical Therapy Treatment Patient Details Name: Sheryl Hamilton MRN: [id number] DOB: Jun 25, 1950 Today's Date: 06/12/2014    History of Present Illness s/p L TKA    PT Comments    POD #1 PM session; pt was in bed KI placed on L Knee; assisted patient w/ L LE 10% to EOB; pt EOB to sit to stand via walker; ambulate in unit hall 161ft; pt no c/o increase in pain with ambulating just "feels achy, sore, and shooting"; returned back to room and into bed; pt then asked to use BR; assisted to BR and to notify NT; ICE left on bed to be placed on L knee; Notified NT pt ready for CPM.   Follow Up Recommendations  Home health PT     Equipment Recommendations  None recommended by PT    Recommendations for Other Services       Precautions / Restrictions Precautions Precautions: Knee Precaution Comments: did use KI this session for increased stability during amb Required Braces or Orthoses: Knee Immobilizer - Left Knee Immobilizer - Left: Discontinue once straight leg raise with < 10 degree lag Restrictions Weight Bearing Restrictions: No Other Position/Activity Restrictions: WBAT    Mobility  Bed Mobility Overal bed mobility: Needs Assistance Bed Mobility: Supine to Sit     Supine to sit: Min guard Sit to supine: Min guard   General bed mobility comments: Assisted pt 10% w LE   Transfers Overall transfer level: Needs assistance Equipment used: Rolling walker (2 wheeled) Transfers: Sit to/from Stand Sit to Stand: Min guard         General transfer comment: Cues for hand placement  Ambulation/Gait Ambulation/Gait assistance: Supervision;Min guard Ambulation Distance (Feet): 100 Feet Assistive device: Rolling walker (2 wheeled) Gait Pattern/deviations: Step-to pattern;Decreased stance time - left Gait velocity: decreased   General Gait Details: Occasional cues for sequence    Stairs            Wheelchair Mobility    Modified Rankin (Stroke Patients Only)        Balance                                    Cognition Arousal/Alertness: Awake/alert Behavior During Therapy: WFL for tasks assessed/performed Overall Cognitive Status: Within Functional Limits for tasks assessed                      Exercises      General Comments        Pertinent Vitals/Pain Pain Assessment: 0-10 Pain Score: 3  Pain Location: L Knee Pain Descriptors / Indicators: Aching;Spasm;Sore;Shooting Pain Intervention(s): Monitored during session;Premedicated before session;Repositioned;Ice applied    Home Living Family/patient expects to be discharged to:: Private residence Living Arrangements: Spouse/significant other   Type of Home: House Home Access: Stairs to enter   Home Layout: One level Home Equipment: Environmental consultant - 2 wheels;Cane - single point;Bedside commode;Shower seat      Prior Function Level of Independence: Independent          PT Goals (current goals can now be found in the care plan section) Acute Rehab PT Goals Patient Stated Goal: return to independence. Progress towards PT goals: Progressing toward goals    Frequency  BID    PT Plan Current plan remains appropriate    Co-evaluation             End of Session Equipment Utilized During Treatment: Gait  belt Activity Tolerance: Patient tolerated treatment well Patient left: in bed;with call bell/phone within reach     Time:   1340-1355    Charges:    1 gt                     G Codes:      Duric,[place]re[place] Student PTA 06/12/2014, 2:35 PM   Reviewed above and agree  Rica Koyanagi  PTA [facility]  Acute  Rehab Pager      [phone]

## 2014-06-12 NOTE — Progress Notes (Signed)
Physical Therapy Treatment Patient Details Name: Sheryl Hamilton MRN: [id number] DOB: July 14, 1950 Today's Date: 06/12/2014    History of Present Illness s/p L TKA    PT Comments    POD # 1 am session.  Assisted pt OOB to amb a great distance then returned to room and performed all supine TKR TE's following HEP handout followed by ICE.  Tolerated session well.  Follow Up Recommendations  Home health PT     Equipment Recommendations  None recommended by PT    Recommendations for Other Services       Precautions / Restrictions Precautions Precautions: Knee Precaution Comments: did use KI this session for increased stability during amb Knee Immobilizer - Left: Discontinue once straight leg raise with < 10 degree lag Restrictions Weight Bearing Restrictions: No Other Position/Activity Restrictions: WBAT    Mobility  Bed Mobility Overal bed mobility: Needs Assistance Bed Mobility: Supine to Sit       Sit to supine: Min guard   General bed mobility comments: cues for  technique, assist with LLE  Transfers Overall transfer level: Needs assistance Equipment used: Rolling walker (2 wheeled) Transfers: Sit to/from Stand Sit to Stand: Supervision;Min guard         General transfer comment: cues for hand placement and LLE position  Ambulation/Gait Ambulation/Gait assistance: Supervision;Min guard Ambulation Distance (Feet): 75 Feet Assistive device: Rolling walker (2 wheeled) Gait Pattern/deviations: Step-to pattern;Decreased stance time - left Gait velocity: decreased   General Gait Details: cues for sequence, RW distance from self   Stairs            Wheelchair Mobility    Modified Rankin (Stroke Patients Only)       Balance                                    Cognition Arousal/Alertness: Awake/alert Behavior During Therapy: WFL for tasks assessed/performed Overall Cognitive Status: Within Functional Limits for tasks assessed                      Exercises   Total Knee Replacement TE's 10 reps B LE ankle pumps 10 reps towel squeezes 10 reps knee presses 10 reps heel slides  10 reps SAQ's 10 reps SLR's 10 reps ABD Followed by ICE    General Comments        Pertinent Vitals/Pain Pain Assessment: 0-10 Pain Score: 3  Pain Location: L knee Pain Descriptors / Indicators: Aching;Sore Pain Intervention(s): Monitored during session;Premedicated before session;Repositioned;Ice applied    Home Living                      Prior Function            PT Goals (current goals can now be found in the care plan section) Progress towards PT goals: Progressing toward goals    Frequency       PT Plan Current plan remains appropriate    Co-evaluation             End of Session Equipment Utilized During Treatment: Gait belt Activity Tolerance: Patient tolerated treatment well Patient left: in chair;with call bell/phone within reach;with family/visitor present     Time: 0569-7948 PT Time Calculation (min) (ACUTE ONLY): 31 min  Charges:  $Gait Training: 8-22 mins $Therapeutic Exercise: 8-22 mins  G Codes:      Nathanial Rancher 06/28/2014, 10:18 AM

## 2014-06-12 NOTE — Care Management Note (Addendum)
Case Management Note  Patient Details  Name: Sheryl Hamilton MRN: [id number] Date of Birth: 07-10-1950  Subjective/Objective:                  LEFT TOTAL KNEE ARTHROPLASTY (Left)  Action/Plan:  Discharge planning Expected Discharge Date:  06/13/14               Expected Discharge Plan:  [place]  In-House Referral:     Discharge planning Services  CM Consult  Post Acute Care Choice:  Home Health Choice offered to:     DME Arranged:    DME Agency:     HH Arranged:  PT HH Agency:  [facility]  Status of Service:  Completed, signed off  Medicare Important Message Given:    Date Medicare IM Given:    Medicare IM give by:    Date Additional Medicare IM Given:    Additional Medicare Important Message give by:     If discussed at [place] of Stay Meetings, dates discussed:    Additional Comments: CM met with pt in room to offer choice of home health agency.  Pt chooses [facility] to render HHPT.  Address and contact information verified by pt.  Pt has DME at home.  Referral emailed to [employer], Tim.  No other CM needs were communicated.   Dellie Catholic, RN 06/12/2014, 11:43 AM

## 2014-06-12 NOTE — Evaluation (Signed)
Occupational Therapy One Time Evaluation Patient Details Name: Sheryl Hamilton MRN: [id number] DOB: 1950-12-04 Today's Date: 06/12/2014    History of Present Illness s/p L TKA   Clinical Impression   Pt doing well. All education completed and no further acute OT needs. Pt has family available to assist at d/c.     Follow Up Recommendations  No OT follow up    Equipment Recommendations  None recommended by OT    Recommendations for Other Services       Precautions / Restrictions Precautions Precautions: Knee Precaution Comments: did use KI this session for increased stability during amb Required Braces or Orthoses: Knee Immobilizer - Left Knee Immobilizer - Left: Discontinue once straight leg raise with < 10 degree lag Restrictions Weight Bearing Restrictions: No Other Position/Activity Restrictions: WBAT      Mobility Bed Mobility             Transfers Overall transfer level: Needs assistance Equipment used: Rolling walker (2 wheeled) Transfers: Sit to/from Stand Sit to Stand: Min guard         General transfer comment: cues for hand placement.    Balance                                            ADL Overall ADL's : Needs assistance/impaired Eating/Feeding: Independent;Sitting   Grooming: Wash/dry hands;Min guard;Standing   Upper Body Bathing: Set up;Sitting   Lower Body Bathing: Min guard;Sit to/from stand   Upper Body Dressing : Set up;Sitting   Lower Body Dressing: Min guard;Sit to/from stand   Toilet Transfer: Min guard;Ambulation;BSC;RW   Toileting- Water quality scientist and Hygiene: Min guard;Sit to/from stand         General ADL Comments: Pt doing well. Pt's ex husband is present and will be assisting as well as her daughter. Pt can reach to L foot for LB dressing. Educated on sequence for LB dressing and how to don/doff KI. Educated on making sure walker is in front of her when she stands to pull up clothing, etc.  Pt did well with wide 3in1 as she has a wide one at home but she feels it will work fine for her. Demonstrated shower transfer technique and placement of seat and pt verbalizes understanding. She declines need to practice or need for a handout. Encouraged pt to have assist available for initial showers.      Vision     Perception     Praxis      Pertinent Vitals/Pain Pain Assessment: 0-10 Pain Score: 3  Pain Location: L knee Pain Descriptors / Indicators: Aching;Sore Pain Intervention(s): Repositioned;Ice applied     Hand Dominance     Extremity/Trunk Assessment Upper Extremity Assessment Upper Extremity Assessment: Overall WFL for tasks assessed           Communication Communication Communication: No difficulties   Cognition Arousal/Alertness: Awake/alert Behavior During Therapy: WFL for tasks assessed/performed Overall Cognitive Status: Within Functional Limits for tasks assessed                     General Comments       Exercises       Shoulder Instructions      Home Living Family/patient expects to be discharged to:: Private residence Living Arrangements: Spouse/significant other   Type of Home: House Home Access: Stairs to enter [employer] of Steps: 1  Home Layout: One level     Bathroom Shower/Tub: Occupational psychologist: Standard     Home Equipment: Environmental consultant - 2 wheels;Cane - single point;Bedside commode;Shower seat          Prior Functioning/Environment Level of Independence: Independent             OT Diagnosis: Generalized weakness   OT Problem List:     OT Treatment/Interventions:      OT Goals(Current goals can be found in the care plan section) Acute Rehab OT Goals Patient Stated Goal: return to independence. OT Goal Formulation: With patient  OT Frequency:     Barriers to D/C:            Co-evaluation              End of Session Equipment Utilized During Treatment: Rolling  walker;Left knee immobilizer  Activity Tolerance: Patient tolerated treatment well Patient left: in chair;with call bell/phone within reach;with family/visitor present   Time: 1055-1120 OT Time Calculation (min): 25 min Charges:  OT General Charges $OT Visit: 1 Procedure OT Evaluation $Initial OT Evaluation Tier I: 1 Procedure OT Treatments $Therapeutic Activity: 8-22 mins G-Codes:    Jules Schick  [phone] 06/12/2014, 12:04 PM

## 2014-06-12 NOTE — Progress Notes (Signed)
   Subjective: 1 Day Post-Op Procedure(s) (LRB): LEFT TOTAL KNEE ARTHROPLASTY (Left) Patient reports pain as mild.   Patient seen in rounds with Dr. Wynelle Link.  Doing good this morning.  Able to get some sleep the night of surgery. Patient is well, and has had no acute complaints or problems We will start therapy today.  Plan is to go Home after hospital stay.  Objective: Vital signs in last 24 hours: Temp:  [97.2 F (36.2 C)-98 F (36.7 C)] 97.4 F (36.3 C) (05/03 0603) Pulse Rate:  [66-77] 76 (05/03 0603) Resp:  [13-16] 16 (05/03 0603) BP: (102-149)/(57-82) 102/63 mmHg (05/03 0603) SpO2:  [97 %-100 %] 97 % (05/03 0603) Weight:  [71.668 kg (158 lb)] 71.668 kg (158 lb) (05/02 1000)  Intake/Output from previous day:  Intake/Output Summary (Last 24 hours) at 06/12/14 0855 Last data filed at 06/12/14 0700  Gross per 24 hour  Intake 3432.5 ml  Output   3935 ml  Net -502.5 ml    Intake/Output this shift: UOP about 1500 since around MN  Labs:  Recent Labs  06/12/14 0420  HGB 10.5*    Recent Labs  06/12/14 0420  WBC 16.4*  RBC 3.52*  HCT 32.7*  PLT 253    Recent Labs  06/12/14 0420  NA 139  K 4.4  CL 108  CO2 27  BUN 9  CREATININE 0.99  GLUCOSE 164*  CALCIUM 8.5*   No results for input(s): LABPT, INR in the last 72 hours.  EXAM General - Patient is Alert, Appropriate and Oriented Extremity - Neurovascular intact Sensation intact distally Dorsiflexion/Plantar flexion intact Dressing - dressing C/D/I Motor Function - intact, moving foot and toes well on exam.  Hemovac pulled without difficulty.  Past Medical History  Diagnosis Date  . Hypertension   . Hypothyroid   . Allergy     seasonal  . Arthritis     Assessment/Plan: 1 Day Post-Op Procedure(s) (LRB): LEFT TOTAL KNEE ARTHROPLASTY (Left) Principal Problem:   OA (osteoarthritis) of knee  Estimated body mass index is 25.51 kg/(m^2) as calculated from the following:   Height as of this  encounter: 5\' 6"  (1.676 m).   Weight as of this encounter: 71.668 kg (158 lb). Advance diet Up with therapy Plan for discharge tomorrow Discharge home with home health  DVT Prophylaxis - Xarelto Weight-Bearing as tolerated to left leg D/C O2 and Pulse OX and try on Room Air  Comments:  Home with [place].  No DME needs.  Borrowing a RW and a 3-in-1.  Outpatient therapy scheduled at [facility] on 06/22/2014.  Arlee Muslim, PA-C Orthopaedic Surgery 06/12/2014, 8:55 AM

## 2014-06-13 LAB — BASIC METABOLIC PANEL
ANION GAP: 6 (ref 5–15)
BUN: 13 mg/dL (ref 6–20)
CO2: 31 mmol/L (ref 22–32)
Calcium: 9 mg/dL (ref 8.9–10.3)
Chloride: 106 mmol/L (ref 101–111)
Creatinine, Ser: 0.87 mg/dL (ref 0.44–1.00)
Glucose, Bld: 136 mg/dL — ABNORMAL HIGH (ref 70–99)
Potassium: 4.2 mmol/L (ref 3.5–5.1)
Sodium: 143 mmol/L (ref 135–145)

## 2014-06-13 LAB — CBC
HEMATOCRIT: 31.2 % — AB (ref 36.0–46.0)
HEMOGLOBIN: 10.2 g/dL — AB (ref 12.0–15.0)
MCH: 30.9 pg (ref 26.0–34.0)
MCHC: 32.7 g/dL (ref 30.0–36.0)
MCV: 94.5 fL (ref 78.0–100.0)
Platelets: 261 10*3/uL (ref 150–400)
RBC: 3.3 MIL/uL — ABNORMAL LOW (ref 3.87–5.11)
RDW: 14.9 % (ref 11.5–15.5)
WBC: 18.9 10*3/uL — ABNORMAL HIGH (ref 4.0–10.5)

## 2014-06-13 MED ORDER — TRAMADOL HCL 50 MG PO TABS: 50.0000 mg | ORAL_TABLET | Freq: Four times a day (QID) | ORAL | Status: DC | PRN

## 2014-06-13 MED ORDER — RIVAROXABAN 10 MG PO TABS: 10.0000 mg | ORAL_TABLET | Freq: Every day | ORAL | Status: DC

## 2014-06-13 MED ORDER — OXYCODONE HCL 5 MG PO TABS: 5.0000 mg | ORAL_TABLET | ORAL | Status: DC | PRN

## 2014-06-13 MED ORDER — METHOCARBAMOL 500 MG PO TABS: 500.0000 mg | ORAL_TABLET | Freq: Four times a day (QID) | ORAL | Status: DC | PRN

## 2014-06-13 MED ORDER — ONDANSETRON HCL 4 MG PO TABS: 4.0000 mg | ORAL_TABLET | Freq: Four times a day (QID) | ORAL | Status: DC | PRN

## 2014-06-13 NOTE — Progress Notes (Signed)
RN reviewed discharge education with patient and family. All questions answered.   Paperwork and prescriptions given.   NT rolled patient down in wheelchair to family car with all belongings.

## 2014-06-13 NOTE — Discharge Summary (Signed)
Physician Discharge Summary   Patient ID: Sheryl Hamilton MRN: [id number] DOB/AGE: [birth year] 64 y.o.  Admit date: 06/11/2014 Discharge date: 06/13/2014  Primary Diagnosis:  Osteoarthritis Left knee(s)  Admission Diagnoses:  Past Medical History  Diagnosis Date  . Hypertension   . Hypothyroid   . Allergy     seasonal  . Arthritis    Discharge Diagnoses:   Principal Problem:   OA (osteoarthritis) of knee  Estimated body mass index is 25.51 kg/(m^2) as calculated from the following:   Height as of this encounter: 5' 6"  (1.676 m).   Weight as of this encounter: 71.668 kg (158 lb).  Procedure:  Procedure(s) (LRB): LEFT TOTAL KNEE ARTHROPLASTY (Left)   Consults: None  HPI: Sheryl Hamilton is a 64 y.o. year old female with end stage OA of her left knee with progressively worsening pain and dysfunction. She has constant pain, with activity and at rest and significant functional deficits with difficulties even with ADLs. She has had extensive non-op management including analgesics, injections of cortisone and viscosupplements, and home exercise program, but remains in significant pain with significant dysfunction. Radiographs show bone on bone arthritis medial and patellofemoral. She presents now for left Total Knee Arthroplasty.  Laboratory Data: Admission on 06/11/2014, Discharged on 06/13/2014  Component Date Value Ref Range Status  . WBC 06/12/2014 16.4* 4.0 - 10.5 K/uL Final  . RBC 06/12/2014 3.52* 3.87 - 5.11 MIL/uL Final  . Hemoglobin 06/12/2014 10.5* 12.0 - 15.0 g/dL Final  . HCT 06/12/2014 32.7* 36.0 - 46.0 % Final  . MCV 06/12/2014 92.9  78.0 - 100.0 fL Final  . MCH 06/12/2014 29.8  26.0 - 34.0 pg Final  . MCHC 06/12/2014 32.1  30.0 - 36.0 g/dL Final  . RDW 06/12/2014 14.4  11.5 - 15.5 % Final  . Platelets 06/12/2014 253  150 - 400 K/uL Final  . Sodium 06/12/2014 139  135 - 145 mmol/L Final  . Potassium 06/12/2014 4.4  3.5 - 5.1 mmol/L Final  . Chloride 06/12/2014 108  101  - 111 mmol/L Final  . CO2 06/12/2014 27  22 - 32 mmol/L Final  . Glucose, Bld 06/12/2014 164* 70 - 99 mg/dL Final  . BUN 06/12/2014 9  6 - 20 mg/dL Final  . Creatinine, Ser 06/12/2014 0.99  0.44 - 1.00 mg/dL Final  . Calcium 06/12/2014 8.5* 8.9 - 10.3 mg/dL Final  . GFR calc non Af Amer 06/12/2014 59* >60 mL/min Final  . GFR calc Af Amer 06/12/2014 >60  >60 mL/min Final   Comment: (NOTE) The eGFR has been calculated using the CKD EPI equation. This calculation has not been validated in all clinical situations. eGFR's persistently <90 mL/min signify possible Chronic Kidney Disease.   . Anion gap 06/12/2014 4* 5 - 15 Final  . WBC 06/13/2014 18.9* 4.0 - 10.5 K/uL Final  . RBC 06/13/2014 3.30* 3.87 - 5.11 MIL/uL Final  . Hemoglobin 06/13/2014 10.2* 12.0 - 15.0 g/dL Final  . HCT 06/13/2014 31.2* 36.0 - 46.0 % Final  . MCV 06/13/2014 94.5  78.0 - 100.0 fL Final  . MCH 06/13/2014 30.9  26.0 - 34.0 pg Final  . MCHC 06/13/2014 32.7  30.0 - 36.0 g/dL Final  . RDW 06/13/2014 14.9  11.5 - 15.5 % Final  . Platelets 06/13/2014 261  150 - 400 K/uL Final  . Sodium 06/13/2014 143  135 - 145 mmol/L Final  . Potassium 06/13/2014 4.2  3.5 - 5.1 mmol/L Final  . Chloride 06/13/2014 106  101 - 111 mmol/L Final  . CO2 06/13/2014 31  22 - 32 mmol/L Final  . Glucose, Bld 06/13/2014 136* 70 - 99 mg/dL Final  . BUN 06/13/2014 13  6 - 20 mg/dL Final  . Creatinine, Ser 06/13/2014 0.87  0.44 - 1.00 mg/dL Final  . Calcium 06/13/2014 9.0  8.9 - 10.3 mg/dL Final  . GFR calc non Af Amer 06/13/2014 >60  >60 mL/min Final  . GFR calc Af Amer 06/13/2014 >60  >60 mL/min Final   Comment: (NOTE) The eGFR has been calculated using the CKD EPI equation. This calculation has not been validated in all clinical situations. eGFR's persistently <90 mL/min signify possible Chronic Kidney Disease.   Georgiann Hahn gap 06/13/2014 6  5 - 15 Final  Hospital Outpatient Visit on 06/04/2014  Component Date Value Ref Range Status  . aPTT  06/04/2014 31  24 - 37 seconds Final  . WBC 06/04/2014 7.6  4.0 - 10.5 K/uL Final  . RBC 06/04/2014 4.46  3.87 - 5.11 MIL/uL Final  . Hemoglobin 06/04/2014 13.4  12.0 - 15.0 g/dL Final  . HCT 06/04/2014 41.1  36.0 - 46.0 % Final  . MCV 06/04/2014 92.2  78.0 - 100.0 fL Final  . MCH 06/04/2014 30.0  26.0 - 34.0 pg Final  . MCHC 06/04/2014 32.6  30.0 - 36.0 g/dL Final  . RDW 06/04/2014 13.9  11.5 - 15.5 % Final  . Platelets 06/04/2014 279  150 - 400 K/uL Final  . Sodium 06/04/2014 142  135 - 145 mmol/L Final  . Potassium 06/04/2014 3.7  3.5 - 5.1 mmol/L Final  . Chloride 06/04/2014 107  96 - 112 mmol/L Final  . CO2 06/04/2014 29  19 - 32 mmol/L Final  . Glucose, Bld 06/04/2014 88  70 - 99 mg/dL Final  . BUN 06/04/2014 13  6 - 23 mg/dL Final  . Creatinine, Ser 06/04/2014 0.85  0.50 - 1.10 mg/dL Final  . Calcium 06/04/2014 9.2  8.4 - 10.5 mg/dL Final  . Total Protein 06/04/2014 7.1  6.0 - 8.3 g/dL Final  . Albumin 06/04/2014 4.4  3.5 - 5.2 g/dL Final  . AST 06/04/2014 21  0 - 37 U/L Final  . ALT 06/04/2014 16  0 - 35 U/L Final  . Alkaline Phosphatase 06/04/2014 63  39 - 117 U/L Final  . Total Bilirubin 06/04/2014 0.7  0.3 - 1.2 mg/dL Final  . GFR calc non Af Amer 06/04/2014 70* >90 mL/min Final  . GFR calc Af Amer 06/04/2014 82* >90 mL/min Final   Comment: (NOTE) The eGFR has been calculated using the CKD EPI equation. This calculation has not been validated in all clinical situations. eGFR's persistently <90 mL/min signify possible Chronic Kidney Disease.   . Anion gap 06/04/2014 6  5 - 15 Final  . Prothrombin Time 06/04/2014 13.3  11.6 - 15.2 seconds Final  . INR 06/04/2014 1.00  0.00 - 1.49 Final  . Color, Urine 06/04/2014 YELLOW  YELLOW Final  . APPearance 06/04/2014 CLEAR  CLEAR Final  . Specific Gravity, Urine 06/04/2014 1.021  1.005 - 1.030 Final  . pH 06/04/2014 5.5  5.0 - 8.0 Final  . Glucose, UA 06/04/2014 NEGATIVE  NEGATIVE mg/dL Final  . Hgb urine dipstick 06/04/2014  TRACE* NEGATIVE Final  . Bilirubin Urine 06/04/2014 NEGATIVE  NEGATIVE Final  . Ketones, ur 06/04/2014 NEGATIVE  NEGATIVE mg/dL Final  . Protein, ur 06/04/2014 NEGATIVE  NEGATIVE mg/dL Final  . Urobilinogen, UA 06/04/2014 1.0  0.0 -  1.0 mg/dL Final  . Nitrite 06/04/2014 NEGATIVE  NEGATIVE Final  . Leukocytes, UA 06/04/2014 NEGATIVE  NEGATIVE Final  . MRSA, PCR 06/04/2014 NEGATIVE  NEGATIVE Final  . Staphylococcus aureus 06/04/2014 POSITIVE* NEGATIVE Final   Comment:        The Xpert SA Assay (FDA approved for NASAL specimens in patients over 69 years of age), is one component of a comprehensive surveillance program.  Test performance has been validated by [facility] for patients greater than or equal to 94 year old. It is not intended to diagnose infection nor to guide or monitor treatment.   . ABO/RH(D) 06/04/2014 O POS   Final  . Antibody Screen 06/04/2014 NEG   Final  . Sample Expiration 06/04/2014 06/14/2014   Final  . Squamous Epithelial / LPF 06/04/2014 RARE  RARE Final  . RBC / HPF 06/04/2014 3-6  <3 RBC/hpf Final  . Urine-Other 06/04/2014 MUCOUS PRESENT   Final  . ABO/RH(D) 06/04/2014 O POS   Final     X-Rays:No results found.  EKG: Orders placed or performed during the hospital encounter of 06/04/14  . EKG 12-Lead  . EKG 12-Lead     Hospital Course: LILIANE MALLIS is a 64 y.o. who was admitted to [facility]. They were brought to the operating room on 06/11/2014 and underwent Procedure(s): LEFT TOTAL KNEE ARTHROPLASTY.  Patient tolerated the procedure well and was later transferred to the recovery room and then to the orthopaedic floor for postoperative care.  They were given PO and IV analgesics for pain control following their surgery.  They were given 24 hours of postoperative antibiotics of  Anti-infectives    Start     Dose/Rate Route Frequency Ordered Stop   06/11/14 1400  ceFAZolin (ANCEF) IVPB 2 g/50 mL premix     2 g 100 mL/hr over 30 Minutes  Intravenous Every 6 hours 06/11/14 0955 06/11/14 2123   06/11/14 0514  ceFAZolin (ANCEF) IVPB 2 g/50 mL premix     2 g 100 mL/hr over 30 Minutes Intravenous On call to O.R. 06/11/14 3976 06/11/14 0709     and started on DVT prophylaxis in the form of Xarelto.   PT and OT were ordered for total joint protocol.  Discharge planning consulted to help with postop disposition and equipment needs.  Patient had a fairly decent night on the evening of surgery.  They started to get up OOB with therapy on day one. Hemovac drain was pulled without difficulty.  Continued to work with therapy into day two.  Dressing was changed on day two and the incision was healing well.  Patient was seen in rounds and was ready to go home.  Discharge home with home health Diet - Cardiac diet Follow up - in 2 weeks, on Tuesday 06/26/2014 with Dr. Wynelle Link Activity - WBAT Disposition - Home Condition Upon Discharge - Good D/C Meds - See DC Summary DVT Prophylaxis - Xarelto      Discharge Instructions    Call MD / Call 911    Complete by:  As directed   If you experience chest pain or shortness of breath, CALL 911 and be transported to the hospital emergency room.  If you develope a fever above 101 F, pus (white drainage) or increased drainage or redness at the wound, or calf pain, call your surgeon's office.     Change dressing    Complete by:  As directed   Change dressing daily with sterile 4 x 4 inch  gauze dressing and apply TED hose. Do not submerge the incision under water.     Constipation Prevention    Complete by:  As directed   Drink plenty of fluids.  Prune juice may be helpful.  You may use a stool softener, such as Colace (over the counter) 100 mg twice a day.  Use MiraLax (over the counter) for constipation as needed.     Diet - low sodium heart healthy    Complete by:  As directed      Discharge instructions    Complete by:  As directed   Pick up stool softner and laxative for home use following surgery  while on pain medications. Do not submerge incision under water. Please use good hand washing techniques while changing dressing each day. May shower starting three days after surgery. Please use a clean towel to pat the incision dry following showers. Continue to use ice for pain and swelling after surgery. Do not use any lotions or creams on the incision until instructed by your surgeon.  Take Xarelto for two and a half more weeks, then discontinue Xarelto. Once the patient has completed the blood thinner regimen, then take a Baby 81 mg Aspirin daily for three more weeks.  Postoperative Constipation Protocol  Constipation - defined medically as fewer than three stools per week and severe constipation as less than one stool per week.  One of the most common issues patients have following surgery is constipation.  Even if you have a regular bowel pattern at home, your normal regimen is likely to be disrupted due to multiple reasons following surgery.  Combination of anesthesia, postoperative narcotics, change in appetite and fluid intake all can affect your bowels.  In order to avoid complications following surgery, here are some recommendations in order to help you during your recovery period.  Colace (docusate) - Pick up an over-the-counter form of Colace or another stool softener and take twice a day as long as you are requiring postoperative pain medications.  Take with a full glass of water daily.  If you experience loose stools or diarrhea, hold the colace until you stool forms back up.  If your symptoms do not get better within 1 week or if they get worse, check with your doctor.  Dulcolax (bisacodyl) - Pick up over-the-counter and take as directed by the product packaging as needed to assist with the movement of your bowels.  Take with a full glass of water.  Use this product as needed if not relieved by Colace only.   MiraLax (polyethylene glycol) - Pick up over-the-counter to have on  hand.  MiraLax is a solution that will increase the amount of water in your bowels to assist with bowel movements.  Take as directed and can mix with a glass of water, juice, soda, coffee, or tea.  Take if you go more than two days without a movement. Do not use MiraLax more than once per day. Call your doctor if you are still constipated or irregular after using this medication for 7 days in a row.  If you continue to have problems with postoperative constipation, please contact the office for further assistance and recommendations.  If you experience "the worst abdominal pain ever" or develop nausea or vomiting, please contact the office immediatly for further recommendations for treatment.     Do not put a pillow under the knee. Place it under the heel.    Complete by:  As directed  Do not sit on low chairs, stoools or toilet seats, as it may be difficult to get up from low surfaces    Complete by:  As directed      Driving restrictions    Complete by:  As directed   No driving until released by the physician.     Increase activity slowly as tolerated    Complete by:  As directed      Lifting restrictions    Complete by:  As directed   No lifting until released by the physician.     Patient may shower    Complete by:  As directed   You may shower without a dressing once there is no drainage.  Do not wash over the wound.  If drainage remains, do not shower until drainage stops.     TED hose    Complete by:  As directed   Use stockings (TED hose) for 3 weeks on both leg(s).  You may remove them at night for sleeping.     Weight bearing as tolerated    Complete by:  As directed   Laterality:  left  Extremity:  Lower            Medication List    STOP taking these medications        ibuprofen 200 MG tablet  Commonly known as:  ADVIL,MOTRIN      TAKE these medications        dexlansoprazole 60 MG capsule  Commonly known as:  DEXILANT  Take 1 capsule (60 mg total) by mouth  daily.     levothyroxine 75 MCG tablet  Commonly known as:  SYNTHROID, LEVOTHROID  Take 75 mcg by mouth daily before breakfast.     losartan 100 MG tablet  Commonly known as:  COZAAR  Take 100 mg by mouth every morning.     methocarbamol 500 MG tablet  Commonly known as:  ROBAXIN  Take 1 tablet (500 mg total) by mouth every 6 (six) hours as needed for muscle spasms.     ondansetron 4 MG tablet  Commonly known as:  ZOFRAN  Take 1 tablet (4 mg total) by mouth every 6 (six) hours as needed for nausea.     oxyCODONE 5 MG immediate release tablet  Commonly known as:  Oxy IR/ROXICODONE  Take 1-2 tablets (5-10 mg total) by mouth every 3 (three) hours as needed for moderate pain, severe pain or breakthrough pain.     PARoxetine 20 MG tablet  Commonly known as:  PAXIL  Take 20 mg by mouth every morning.     rivaroxaban 10 MG Tabs tablet  Commonly known as:  XARELTO  - Take 1 tablet (10 mg total) by mouth daily with breakfast. Take Xarelto for two and a half more weeks, then discontinue Xarelto.  - Once the patient has completed the blood thinner regimen, then take a Baby 81 mg Aspirin daily for three more weeks.     SINUS & ALLERGY PO  Take 1 tablet by mouth daily as needed (sinus headache.).     traMADol 50 MG tablet  Commonly known as:  ULTRAM  Take 1-2 tablets (50-100 mg total) by mouth every 6 (six) hours as needed (mild pain).     VISINE-A OP  Apply 1-2 drops to eye daily as needed (allergies.).       Follow-up Information    Follow up with [name], MD. Schedule an appointment as soon as possible for a visit on  06/26/2014.   Specialty:  Orthopedic Surgery   Why:  Call office at [phone] to set up appointment on Tuesday 06/26/2014 with Dr. Wynelle Link.   Contact information:   [address] [place] [address] [phone]       Signed: Arlee Muslim, PA-C Orthopaedic Surgery 06/28/2014, 9:09 AM

## 2014-06-13 NOTE — Progress Notes (Addendum)
Physical Therapy Treatment Patient Details Name: Sheryl Hamilton MRN: [id number] DOB: 12-31-1950 Today's Date: 06/13/2014    History of Present Illness s/p L TKA    PT Comments    POD # 2.  Pt eager to D/C to home today.  Performed all supine TKR TE's following HEP handout.  Care giver present during session and educated on all below.  Pt ready for D/C to home.  Follow Up Recommendations  Home health PT     Equipment Recommendations  None recommended by PT (has all equipment)    Recommendations for Other Services       Precautions / Restrictions Precautions Precautions: Knee Precaution Comments: did use KI this session for increased stability during amb and negociating up/down one step safely Knee Immobilizer - Left: Discontinue once straight leg raise with < 10 degree lag Restrictions Weight Bearing Restrictions: No Other Position/Activity Restrictions: WBAT    Mobility  Bed Mobility               General bed mobility comments: Pt OOB in recliner  Transfers Overall transfer level: Needs assistance Equipment used: Rolling walker (2 wheeled) Transfers: Sit to/from Stand Sit to Stand: Supervision         General transfer comment: one VC to L LE asdvancement prior to sit  Ambulation/Gait Ambulation/Gait assistance: Supervision Ambulation Distance (Feet): 110 Feet Assistive device: Rolling walker (2 wheeled) Gait Pattern/deviations: Step-to pattern;Decreased stance time - left Gait velocity: decreased but functional   General Gait Details: one VC safety with turns and backward gait to recliner   Stairs Stairs: Yes Stairs assistance: Min guard Stair Management: No rails;Step to pattern;Forwards;With walker Number of Stairs: 1 (performed twice) General stair comments: with Ex spouse (caregiver) present for education and proper tech.  Instructed pt to wear KI for increased support to get into home.  Wheelchair Mobility    Modified Rankin (Stroke Patients  Only)       Balance                                    Cognition Arousal/Alertness: Awake/alert Behavior During Therapy: WFL for tasks assessed/performed Overall Cognitive Status: Within Functional Limits for tasks assessed                      Exercises   Total Knee Replacement TE's 10 reps B LE ankle pumps 10 reps towel squeezes 10 reps knee presses 10 reps heel slides  10 reps SAQ's 10 reps SLR's 10 reps ABD Followed by ICE     General Comments        Pertinent Vitals/Pain Pain Assessment: 0-10 Pain Score: 5  Pain Location: L knee Pain Descriptors / Indicators: Aching;Sore;Spasm Pain Intervention(s): Monitored during session;Premedicated before session;Repositioned;Ice applied    Home Living                      Prior Function            PT Goals (current goals can now be found in the care plan section) Progress towards PT goals: Progressing toward goals    Frequency       PT Plan Current plan remains appropriate    Co-evaluation             End of Session Equipment Utilized During Treatment: Gait belt;Left knee immobilizer Activity Tolerance: Patient tolerated treatment well Patient left: in chair;with call  bell/phone within reach;with family/visitor present     Time: 2025-4270    Charges:  $Gait Training: 8-22 mins $Therapeutic Exercise: 8-22 mins                    G Codes:     Rica Koyanagi  PTA [facility]  Acute  Rehab Pager      [phone]   .s

## 2014-06-13 NOTE — Progress Notes (Signed)
   Subjective: 2 Days Post-Op Procedure(s) (LRB): LEFT TOTAL KNEE ARTHROPLASTY (Left) Patient reports pain as mild.   Patient seen in rounds with Dr. Wynelle Link. Patient is well, and has had no acute complaints or problems Patient is ready to go home.  Objective: Vital signs in last 24 hours: Temp:  [98.3 F (36.8 C)-98.7 F (37.1 C)] 98.6 F (37 C) (05/04 0606) Pulse Rate:  [77-103] 103 (05/04 0606) Resp:  [16] 16 (05/04 0606) BP: (102-133)/(60-72) 133/71 mmHg (05/04 0606) SpO2:  [92 %-93 %] 92 % (05/04 0606)  Intake/Output from previous day:  Intake/Output Summary (Last 24 hours) at 06/13/14 0932 Last data filed at 06/12/14 2200  Gross per 24 hour  Intake 119.33 ml  Output    850 ml  Net -730.67 ml    Labs:  Recent Labs  06/12/14 0420 06/13/14 0500  HGB 10.5* 10.2*    Recent Labs  06/12/14 0420 06/13/14 0500  WBC 16.4* 18.9*  RBC 3.52* 3.30*  HCT 32.7* 31.2*  PLT 253 261    Recent Labs  06/12/14 0420 06/13/14 0500  NA 139 143  K 4.4 4.2  CL 108 106  CO2 27 31  BUN 9 13  CREATININE 0.99 0.87  GLUCOSE 164* 136*  CALCIUM 8.5* 9.0   No results for input(s): LABPT, INR in the last 72 hours.  EXAM: General - Patient is Alert, Appropriate and Oriented Extremity - Neurovascular intact Sensation intact distally Dorsiflexion/Plantar flexion intact Incision - clean, dry, no drainage, healing Motor Function - intact, moving foot and toes well on exam.   Assessment/Plan: 2 Days Post-Op Procedure(s) (LRB): LEFT TOTAL KNEE ARTHROPLASTY (Left) Procedure(s) (LRB): LEFT TOTAL KNEE ARTHROPLASTY (Left) Past Medical History  Diagnosis Date  . Hypertension   . Hypothyroid   . Allergy     seasonal  . Arthritis    Principal Problem:   OA (osteoarthritis) of knee  Estimated body mass index is 25.51 kg/(m^2) as calculated from the following:   Height as of this encounter: 5\' 6"  (1.676 m).   Weight as of this encounter: 71.668 kg (158 lb). Up with  therapy Discharge home with home health Diet - Cardiac diet Follow up - in 2 weeks, on Tuesday 06/26/2014 with Dr. Wynelle Link Activity - WBAT Disposition - Home Condition Upon Discharge - Good D/C [place] Summary DVT Prophylaxis - [place], PA-C Orthopaedic Surgery 06/13/2014, 9:32 AM

## 2014-06-13 NOTE — Plan of Care (Signed)
Problem: Discharge Progression Outcomes Goal: Anticoagulant follow-up in place Outcome: Not Applicable Date Met:  71/25/27 Xarelto VTE, no f/u needed.

## 2014-07-21 IMAGING — CR DG CHEST 2V
2 series · 2 of 2 positions shown · non-contrast
Comparison: None.

CLINICAL DATA: Cough and chest pain

CHEST - 2 VIEW

[w chest pa]
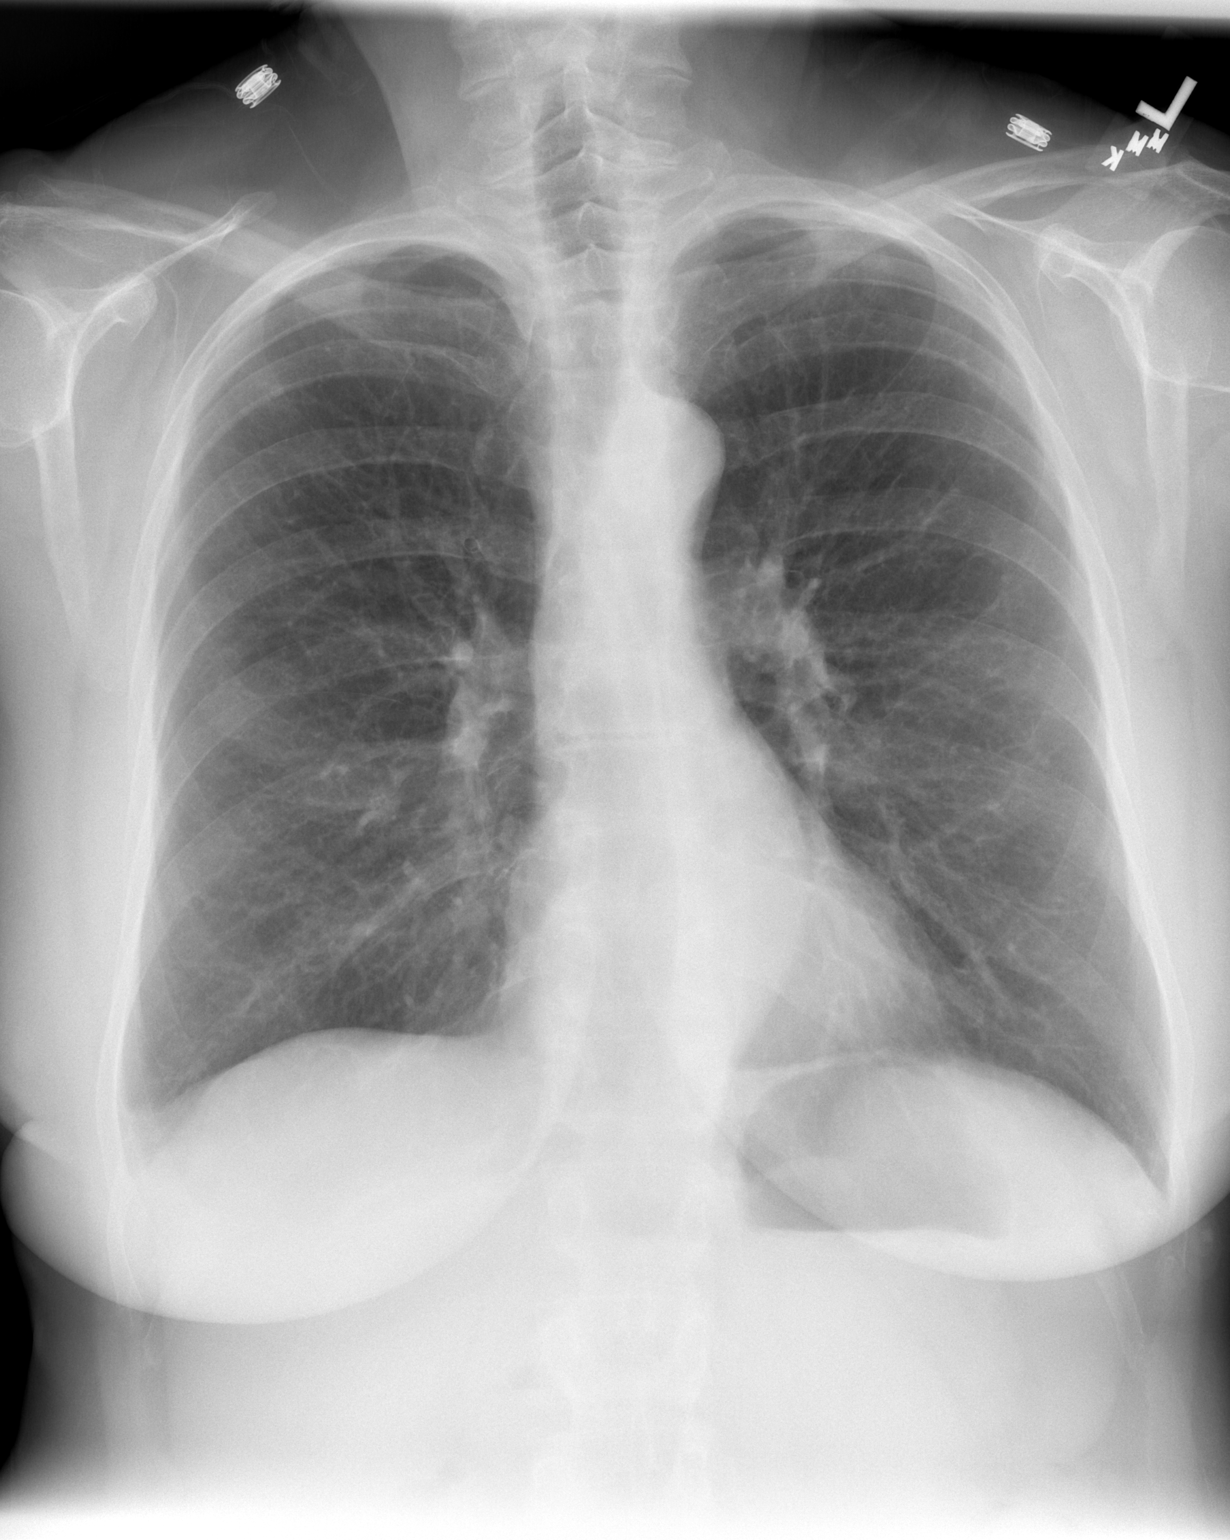

[w chest lat]
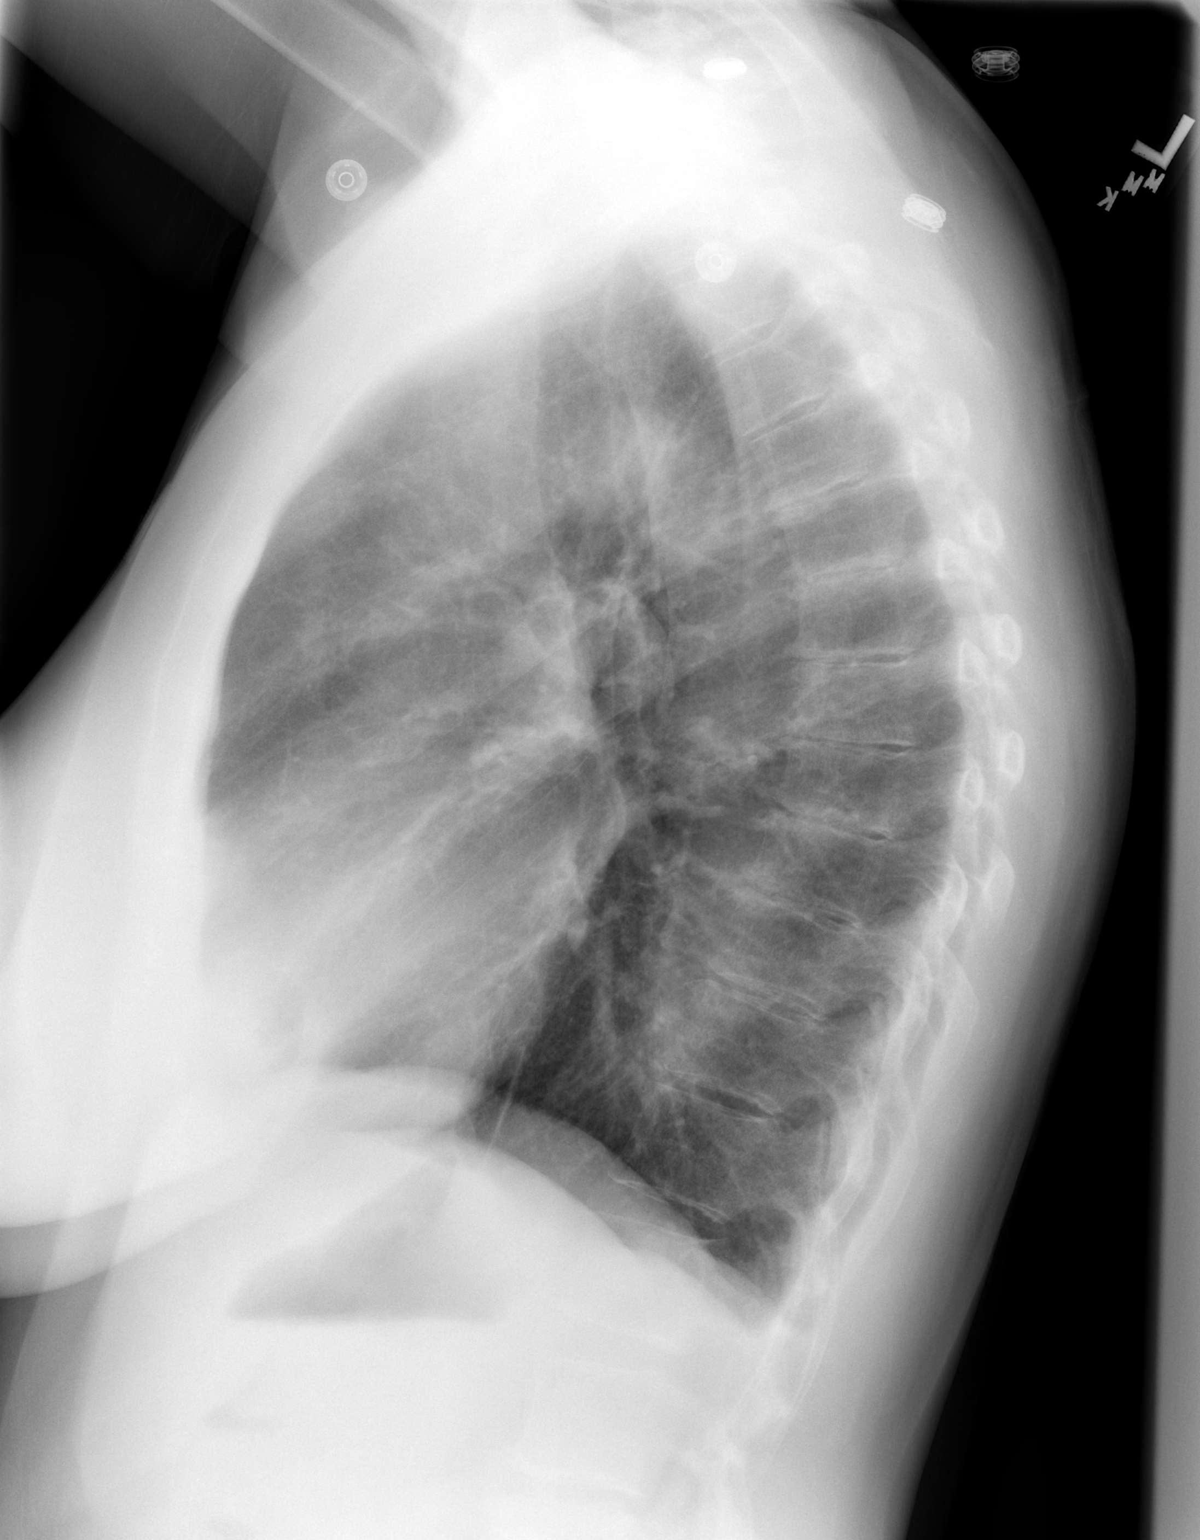

[2 of 2 positions shown; findings below may reference images not displayed]

FINDINGS: Lungs clear.  Heart size and pulmonary vascularity are
normal.  No adenopathy.  No bone lesions.  There is degenerative
change in the thoracic spine.  Aorta is mildly tortuous.
IMPRESSION: No edema or consolidation.

## 2014-11-19 ENCOUNTER — Encounter (HOSPITAL_COMMUNITY): Admission: RE | Payer: Self-pay | Source: Ambulatory Visit

## 2014-11-19 ENCOUNTER — Inpatient Hospital Stay (HOSPITAL_COMMUNITY): Admission: RE | Admit: 2014-11-19 | Payer: Medicare Other | Source: Ambulatory Visit | Admitting: Orthopedic Surgery

## 2014-11-19 SURGERY — ARTHROPLASTY, KNEE, TOTAL
Anesthesia: Choice | Site: Knee | Laterality: Right

## 2015-02-12 DIAGNOSIS — B349 Viral infection, unspecified: Secondary | ICD-10-CM | POA: Diagnosis not present

## 2015-02-13 DIAGNOSIS — R590 Localized enlarged lymph nodes: Secondary | ICD-10-CM | POA: Diagnosis not present

## 2015-02-13 DIAGNOSIS — N63 Unspecified lump in breast: Secondary | ICD-10-CM | POA: Diagnosis not present

## 2015-06-12 DIAGNOSIS — B349 Viral infection, unspecified: Secondary | ICD-10-CM | POA: Diagnosis not present

## 2015-06-13 DIAGNOSIS — Z96652 Presence of left artificial knee joint: Secondary | ICD-10-CM | POA: Diagnosis not present

## 2015-06-13 DIAGNOSIS — Z471 Aftercare following joint replacement surgery: Secondary | ICD-10-CM | POA: Diagnosis not present

## 2015-07-04 DIAGNOSIS — L237 Allergic contact dermatitis due to plants, except food: Secondary | ICD-10-CM | POA: Diagnosis not present

## 2015-07-04 DIAGNOSIS — R300 Dysuria: Secondary | ICD-10-CM | POA: Diagnosis not present

## 2015-07-05 DIAGNOSIS — R300 Dysuria: Secondary | ICD-10-CM | POA: Diagnosis not present

## 2015-07-17 DIAGNOSIS — N949 Unspecified condition associated with female genital organs and menstrual cycle: Secondary | ICD-10-CM | POA: Diagnosis not present

## 2015-07-17 DIAGNOSIS — R0689 Other abnormalities of breathing: Secondary | ICD-10-CM | POA: Diagnosis not present

## 2015-07-17 DIAGNOSIS — L237 Allergic contact dermatitis due to plants, except food: Secondary | ICD-10-CM | POA: Diagnosis not present

## 2015-08-01 DIAGNOSIS — Z6825 Body mass index (BMI) 25.0-25.9, adult: Secondary | ICD-10-CM | POA: Diagnosis not present

## 2015-08-01 DIAGNOSIS — M170 Bilateral primary osteoarthritis of knee: Secondary | ICD-10-CM | POA: Diagnosis not present

## 2015-08-01 DIAGNOSIS — E559 Vitamin D deficiency, unspecified: Secondary | ICD-10-CM | POA: Diagnosis not present

## 2015-08-01 DIAGNOSIS — I10 Essential (primary) hypertension: Secondary | ICD-10-CM | POA: Diagnosis not present

## 2015-08-01 DIAGNOSIS — E039 Hypothyroidism, unspecified: Secondary | ICD-10-CM | POA: Diagnosis not present

## 2015-08-01 DIAGNOSIS — Z0000 Encounter for general adult medical examination without abnormal findings: Secondary | ICD-10-CM | POA: Diagnosis not present

## 2015-08-14 DIAGNOSIS — N63 Unspecified lump in breast: Secondary | ICD-10-CM | POA: Diagnosis not present

## 2015-11-15 DIAGNOSIS — Z23 Encounter for immunization: Secondary | ICD-10-CM | POA: Diagnosis not present

## 2015-11-15 DIAGNOSIS — E039 Hypothyroidism, unspecified: Secondary | ICD-10-CM | POA: Diagnosis not present

## 2015-11-15 DIAGNOSIS — I10 Essential (primary) hypertension: Secondary | ICD-10-CM | POA: Diagnosis not present

## 2015-11-15 DIAGNOSIS — E559 Vitamin D deficiency, unspecified: Secondary | ICD-10-CM | POA: Diagnosis not present

## 2015-12-03 DIAGNOSIS — H40013 Open angle with borderline findings, low risk, bilateral: Secondary | ICD-10-CM | POA: Diagnosis not present

## 2015-12-03 DIAGNOSIS — H2513 Age-related nuclear cataract, bilateral: Secondary | ICD-10-CM | POA: Diagnosis not present

## 2015-12-03 DIAGNOSIS — H04123 Dry eye syndrome of bilateral lacrimal glands: Secondary | ICD-10-CM | POA: Diagnosis not present

## 2015-12-03 DIAGNOSIS — M3501 Sicca syndrome with keratoconjunctivitis: Secondary | ICD-10-CM | POA: Diagnosis not present

## 2016-01-21 DIAGNOSIS — H40013 Open angle with borderline findings, low risk, bilateral: Secondary | ICD-10-CM | POA: Diagnosis not present

## 2016-01-21 DIAGNOSIS — H40051 Ocular hypertension, right eye: Secondary | ICD-10-CM | POA: Diagnosis not present

## 2016-01-21 DIAGNOSIS — M3501 Sicca syndrome with keratoconjunctivitis: Secondary | ICD-10-CM | POA: Diagnosis not present

## 2016-03-05 DIAGNOSIS — E559 Vitamin D deficiency, unspecified: Secondary | ICD-10-CM | POA: Diagnosis not present

## 2016-03-05 DIAGNOSIS — M19042 Primary osteoarthritis, left hand: Secondary | ICD-10-CM | POA: Diagnosis not present

## 2016-03-05 DIAGNOSIS — Z6826 Body mass index (BMI) 26.0-26.9, adult: Secondary | ICD-10-CM | POA: Diagnosis not present

## 2016-03-05 DIAGNOSIS — I10 Essential (primary) hypertension: Secondary | ICD-10-CM | POA: Diagnosis not present

## 2016-03-05 DIAGNOSIS — Z8041 Family history of malignant neoplasm of ovary: Secondary | ICD-10-CM | POA: Diagnosis not present

## 2016-03-05 DIAGNOSIS — M1712 Unilateral primary osteoarthritis, left knee: Secondary | ICD-10-CM | POA: Diagnosis not present

## 2016-03-05 DIAGNOSIS — E039 Hypothyroidism, unspecified: Secondary | ICD-10-CM | POA: Diagnosis not present

## 2016-03-05 DIAGNOSIS — M19041 Primary osteoarthritis, right hand: Secondary | ICD-10-CM | POA: Diagnosis not present

## 2016-03-11 DIAGNOSIS — E039 Hypothyroidism, unspecified: Secondary | ICD-10-CM | POA: Diagnosis not present

## 2016-03-11 DIAGNOSIS — E559 Vitamin D deficiency, unspecified: Secondary | ICD-10-CM | POA: Diagnosis not present

## 2016-03-11 DIAGNOSIS — I10 Essential (primary) hypertension: Secondary | ICD-10-CM | POA: Diagnosis not present

## 2016-05-06 DIAGNOSIS — Z6826 Body mass index (BMI) 26.0-26.9, adult: Secondary | ICD-10-CM | POA: Diagnosis not present

## 2016-05-06 DIAGNOSIS — R300 Dysuria: Secondary | ICD-10-CM | POA: Diagnosis not present

## 2016-06-10 DIAGNOSIS — Z471 Aftercare following joint replacement surgery: Secondary | ICD-10-CM | POA: Diagnosis not present

## 2016-06-10 DIAGNOSIS — Z96652 Presence of left artificial knee joint: Secondary | ICD-10-CM | POA: Diagnosis not present

## 2016-06-10 DIAGNOSIS — M1711 Unilateral primary osteoarthritis, right knee: Secondary | ICD-10-CM | POA: Diagnosis not present

## 2016-07-23 DIAGNOSIS — M1711 Unilateral primary osteoarthritis, right knee: Secondary | ICD-10-CM | POA: Diagnosis not present

## 2016-08-24 DIAGNOSIS — Z1231 Encounter for screening mammogram for malignant neoplasm of breast: Secondary | ICD-10-CM | POA: Diagnosis not present

## 2016-08-31 DIAGNOSIS — F329 Major depressive disorder, single episode, unspecified: Secondary | ICD-10-CM | POA: Diagnosis not present

## 2016-08-31 DIAGNOSIS — I10 Essential (primary) hypertension: Secondary | ICD-10-CM | POA: Diagnosis not present

## 2016-08-31 DIAGNOSIS — E559 Vitamin D deficiency, unspecified: Secondary | ICD-10-CM | POA: Diagnosis not present

## 2016-08-31 DIAGNOSIS — E039 Hypothyroidism, unspecified: Secondary | ICD-10-CM | POA: Diagnosis not present

## 2016-09-02 DIAGNOSIS — Z1389 Encounter for screening for other disorder: Secondary | ICD-10-CM | POA: Diagnosis not present

## 2016-09-02 DIAGNOSIS — I10 Essential (primary) hypertension: Secondary | ICD-10-CM | POA: Diagnosis not present

## 2016-09-02 DIAGNOSIS — F329 Major depressive disorder, single episode, unspecified: Secondary | ICD-10-CM | POA: Diagnosis not present

## 2016-09-02 DIAGNOSIS — R300 Dysuria: Secondary | ICD-10-CM | POA: Diagnosis not present

## 2016-09-02 DIAGNOSIS — Z6824 Body mass index (BMI) 24.0-24.9, adult: Secondary | ICD-10-CM | POA: Diagnosis not present

## 2016-09-02 DIAGNOSIS — E039 Hypothyroidism, unspecified: Secondary | ICD-10-CM | POA: Diagnosis not present

## 2016-09-02 DIAGNOSIS — Z96652 Presence of left artificial knee joint: Secondary | ICD-10-CM | POA: Diagnosis not present

## 2016-10-15 DIAGNOSIS — Z6824 Body mass index (BMI) 24.0-24.9, adult: Secondary | ICD-10-CM | POA: Diagnosis not present

## 2016-10-15 DIAGNOSIS — J028 Acute pharyngitis due to other specified organisms: Secondary | ICD-10-CM | POA: Diagnosis not present

## 2017-01-25 DIAGNOSIS — Z6824 Body mass index (BMI) 24.0-24.9, adult: Secondary | ICD-10-CM | POA: Diagnosis not present

## 2017-01-25 DIAGNOSIS — J069 Acute upper respiratory infection, unspecified: Secondary | ICD-10-CM | POA: Diagnosis not present

## 2017-01-29 DIAGNOSIS — Z6824 Body mass index (BMI) 24.0-24.9, adult: Secondary | ICD-10-CM | POA: Diagnosis not present

## 2017-01-29 DIAGNOSIS — J019 Acute sinusitis, unspecified: Secondary | ICD-10-CM | POA: Diagnosis not present

## 2017-03-01 DIAGNOSIS — Z0000 Encounter for general adult medical examination without abnormal findings: Secondary | ICD-10-CM | POA: Diagnosis not present

## 2017-03-01 DIAGNOSIS — E559 Vitamin D deficiency, unspecified: Secondary | ICD-10-CM | POA: Diagnosis not present

## 2017-03-01 DIAGNOSIS — Z6824 Body mass index (BMI) 24.0-24.9, adult: Secondary | ICD-10-CM | POA: Diagnosis not present

## 2017-03-01 DIAGNOSIS — Z8041 Family history of malignant neoplasm of ovary: Secondary | ICD-10-CM | POA: Diagnosis not present

## 2017-03-01 DIAGNOSIS — I10 Essential (primary) hypertension: Secondary | ICD-10-CM | POA: Diagnosis not present

## 2017-03-01 DIAGNOSIS — Z803 Family history of malignant neoplasm of breast: Secondary | ICD-10-CM | POA: Diagnosis not present

## 2017-03-01 DIAGNOSIS — E039 Hypothyroidism, unspecified: Secondary | ICD-10-CM | POA: Diagnosis not present

## 2017-03-01 DIAGNOSIS — F329 Major depressive disorder, single episode, unspecified: Secondary | ICD-10-CM | POA: Diagnosis not present

## 2017-03-05 DIAGNOSIS — I10 Essential (primary) hypertension: Secondary | ICD-10-CM | POA: Diagnosis not present

## 2017-03-05 DIAGNOSIS — Z6824 Body mass index (BMI) 24.0-24.9, adult: Secondary | ICD-10-CM | POA: Diagnosis not present

## 2017-03-05 DIAGNOSIS — M1712 Unilateral primary osteoarthritis, left knee: Secondary | ICD-10-CM | POA: Diagnosis not present

## 2017-03-05 DIAGNOSIS — Z0001 Encounter for general adult medical examination with abnormal findings: Secondary | ICD-10-CM | POA: Diagnosis not present

## 2017-03-05 DIAGNOSIS — Z23 Encounter for immunization: Secondary | ICD-10-CM | POA: Diagnosis not present

## 2017-03-05 DIAGNOSIS — E559 Vitamin D deficiency, unspecified: Secondary | ICD-10-CM | POA: Diagnosis not present

## 2017-03-05 DIAGNOSIS — E039 Hypothyroidism, unspecified: Secondary | ICD-10-CM | POA: Diagnosis not present

## 2017-03-05 DIAGNOSIS — Z96652 Presence of left artificial knee joint: Secondary | ICD-10-CM | POA: Diagnosis not present

## 2017-08-06 DIAGNOSIS — M1711 Unilateral primary osteoarthritis, right knee: Secondary | ICD-10-CM | POA: Diagnosis not present

## 2017-08-30 DIAGNOSIS — F329 Major depressive disorder, single episode, unspecified: Secondary | ICD-10-CM | POA: Diagnosis not present

## 2017-08-30 DIAGNOSIS — I10 Essential (primary) hypertension: Secondary | ICD-10-CM | POA: Diagnosis not present

## 2017-08-30 DIAGNOSIS — E039 Hypothyroidism, unspecified: Secondary | ICD-10-CM | POA: Diagnosis not present

## 2017-09-03 DIAGNOSIS — M1712 Unilateral primary osteoarthritis, left knee: Secondary | ICD-10-CM | POA: Diagnosis not present

## 2017-09-03 DIAGNOSIS — Z1389 Encounter for screening for other disorder: Secondary | ICD-10-CM | POA: Diagnosis not present

## 2017-09-03 DIAGNOSIS — Z6825 Body mass index (BMI) 25.0-25.9, adult: Secondary | ICD-10-CM | POA: Diagnosis not present

## 2017-09-03 DIAGNOSIS — M1711 Unilateral primary osteoarthritis, right knee: Secondary | ICD-10-CM | POA: Diagnosis not present

## 2017-09-03 DIAGNOSIS — E039 Hypothyroidism, unspecified: Secondary | ICD-10-CM | POA: Diagnosis not present

## 2017-09-03 DIAGNOSIS — Z1331 Encounter for screening for depression: Secondary | ICD-10-CM | POA: Diagnosis not present

## 2017-09-03 DIAGNOSIS — I10 Essential (primary) hypertension: Secondary | ICD-10-CM | POA: Diagnosis not present

## 2017-09-03 DIAGNOSIS — Z96652 Presence of left artificial knee joint: Secondary | ICD-10-CM | POA: Diagnosis not present

## 2017-09-27 DIAGNOSIS — Z1231 Encounter for screening mammogram for malignant neoplasm of breast: Secondary | ICD-10-CM | POA: Diagnosis not present

## 2017-11-01 DIAGNOSIS — Z23 Encounter for immunization: Secondary | ICD-10-CM | POA: Diagnosis not present

## 2017-12-18 DIAGNOSIS — H6692 Otitis media, unspecified, left ear: Secondary | ICD-10-CM | POA: Diagnosis not present

## 2017-12-18 DIAGNOSIS — Z6826 Body mass index (BMI) 26.0-26.9, adult: Secondary | ICD-10-CM | POA: Diagnosis not present

## 2017-12-18 DIAGNOSIS — J019 Acute sinusitis, unspecified: Secondary | ICD-10-CM | POA: Diagnosis not present

## 2018-02-09 HISTORY — PX: EYE SURGERY: SHX253

## 2018-02-14 DIAGNOSIS — H2513 Age-related nuclear cataract, bilateral: Secondary | ICD-10-CM | POA: Diagnosis not present

## 2018-02-14 DIAGNOSIS — H2512 Age-related nuclear cataract, left eye: Secondary | ICD-10-CM | POA: Diagnosis not present

## 2018-02-14 DIAGNOSIS — H25013 Cortical age-related cataract, bilateral: Secondary | ICD-10-CM | POA: Diagnosis not present

## 2018-02-14 DIAGNOSIS — H40013 Open angle with borderline findings, low risk, bilateral: Secondary | ICD-10-CM | POA: Diagnosis not present

## 2018-02-14 DIAGNOSIS — H25012 Cortical age-related cataract, left eye: Secondary | ICD-10-CM | POA: Diagnosis not present

## 2018-02-14 DIAGNOSIS — H35033 Hypertensive retinopathy, bilateral: Secondary | ICD-10-CM | POA: Diagnosis not present

## 2018-03-03 DIAGNOSIS — E039 Hypothyroidism, unspecified: Secondary | ICD-10-CM | POA: Diagnosis not present

## 2018-03-03 DIAGNOSIS — E559 Vitamin D deficiency, unspecified: Secondary | ICD-10-CM | POA: Diagnosis not present

## 2018-03-03 DIAGNOSIS — I10 Essential (primary) hypertension: Secondary | ICD-10-CM | POA: Diagnosis not present

## 2018-03-07 DIAGNOSIS — Z0001 Encounter for general adult medical examination with abnormal findings: Secondary | ICD-10-CM | POA: Diagnosis not present

## 2018-03-07 DIAGNOSIS — I10 Essential (primary) hypertension: Secondary | ICD-10-CM | POA: Diagnosis not present

## 2018-03-07 DIAGNOSIS — Z6825 Body mass index (BMI) 25.0-25.9, adult: Secondary | ICD-10-CM | POA: Diagnosis not present

## 2018-03-08 DIAGNOSIS — H2512 Age-related nuclear cataract, left eye: Secondary | ICD-10-CM | POA: Diagnosis not present

## 2018-03-08 DIAGNOSIS — H25812 Combined forms of age-related cataract, left eye: Secondary | ICD-10-CM | POA: Diagnosis not present

## 2018-03-28 DIAGNOSIS — H25011 Cortical age-related cataract, right eye: Secondary | ICD-10-CM | POA: Diagnosis not present

## 2018-03-28 DIAGNOSIS — H2511 Age-related nuclear cataract, right eye: Secondary | ICD-10-CM | POA: Diagnosis not present

## 2018-04-05 DIAGNOSIS — H25811 Combined forms of age-related cataract, right eye: Secondary | ICD-10-CM | POA: Diagnosis not present

## 2018-04-05 DIAGNOSIS — H2511 Age-related nuclear cataract, right eye: Secondary | ICD-10-CM | POA: Diagnosis not present

## 2018-04-14 DIAGNOSIS — H2511 Age-related nuclear cataract, right eye: Secondary | ICD-10-CM | POA: Diagnosis not present

## 2018-04-22 DIAGNOSIS — M1711 Unilateral primary osteoarthritis, right knee: Secondary | ICD-10-CM | POA: Diagnosis not present

## 2018-04-28 DIAGNOSIS — H52223 Regular astigmatism, bilateral: Secondary | ICD-10-CM | POA: Diagnosis not present

## 2018-04-28 DIAGNOSIS — Z9849 Cataract extraction status, unspecified eye: Secondary | ICD-10-CM | POA: Diagnosis not present

## 2018-04-28 DIAGNOSIS — H59813 Chorioretinal scars after surgery for detachment, bilateral: Secondary | ICD-10-CM | POA: Diagnosis not present

## 2018-04-28 DIAGNOSIS — H40013 Open angle with borderline findings, low risk, bilateral: Secondary | ICD-10-CM | POA: Diagnosis not present

## 2018-04-28 DIAGNOSIS — H18893 Other specified disorders of cornea, bilateral: Secondary | ICD-10-CM | POA: Diagnosis not present

## 2018-04-28 DIAGNOSIS — H40053 Ocular hypertension, bilateral: Secondary | ICD-10-CM | POA: Diagnosis not present

## 2018-04-28 DIAGNOSIS — H524 Presbyopia: Secondary | ICD-10-CM | POA: Diagnosis not present

## 2018-04-28 DIAGNOSIS — H5213 Myopia, bilateral: Secondary | ICD-10-CM | POA: Diagnosis not present

## 2018-04-28 DIAGNOSIS — Z961 Presence of intraocular lens: Secondary | ICD-10-CM | POA: Diagnosis not present

## 2018-09-05 DIAGNOSIS — E039 Hypothyroidism, unspecified: Secondary | ICD-10-CM | POA: Diagnosis not present

## 2018-09-05 DIAGNOSIS — I10 Essential (primary) hypertension: Secondary | ICD-10-CM | POA: Diagnosis not present

## 2018-09-12 DIAGNOSIS — D225 Melanocytic nevi of trunk: Secondary | ICD-10-CM | POA: Diagnosis not present

## 2018-09-12 DIAGNOSIS — L570 Actinic keratosis: Secondary | ICD-10-CM | POA: Diagnosis not present

## 2018-09-12 DIAGNOSIS — L820 Inflamed seborrheic keratosis: Secondary | ICD-10-CM | POA: Diagnosis not present

## 2018-09-12 DIAGNOSIS — X32XXXA Exposure to sunlight, initial encounter: Secondary | ICD-10-CM | POA: Diagnosis not present

## 2018-09-13 DIAGNOSIS — Z1389 Encounter for screening for other disorder: Secondary | ICD-10-CM | POA: Diagnosis not present

## 2018-09-13 DIAGNOSIS — E039 Hypothyroidism, unspecified: Secondary | ICD-10-CM | POA: Diagnosis not present

## 2018-09-13 DIAGNOSIS — F339 Major depressive disorder, recurrent, unspecified: Secondary | ICD-10-CM | POA: Diagnosis not present

## 2018-09-13 DIAGNOSIS — Z1331 Encounter for screening for depression: Secondary | ICD-10-CM | POA: Diagnosis not present

## 2018-09-13 DIAGNOSIS — Z6825 Body mass index (BMI) 25.0-25.9, adult: Secondary | ICD-10-CM | POA: Diagnosis not present

## 2018-09-13 DIAGNOSIS — Z96652 Presence of left artificial knee joint: Secondary | ICD-10-CM | POA: Diagnosis not present

## 2018-09-13 DIAGNOSIS — M7062 Trochanteric bursitis, left hip: Secondary | ICD-10-CM | POA: Diagnosis not present

## 2018-09-13 DIAGNOSIS — I10 Essential (primary) hypertension: Secondary | ICD-10-CM | POA: Diagnosis not present

## 2018-10-10 DIAGNOSIS — L820 Inflamed seborrheic keratosis: Secondary | ICD-10-CM | POA: Diagnosis not present

## 2018-10-11 DIAGNOSIS — Z1231 Encounter for screening mammogram for malignant neoplasm of breast: Secondary | ICD-10-CM | POA: Diagnosis not present

## 2018-11-16 ENCOUNTER — Encounter: Payer: Self-pay | Admitting: Gastroenterology

## 2018-12-12 DIAGNOSIS — H40013 Open angle with borderline findings, low risk, bilateral: Secondary | ICD-10-CM | POA: Diagnosis not present

## 2018-12-12 DIAGNOSIS — H524 Presbyopia: Secondary | ICD-10-CM | POA: Diagnosis not present

## 2018-12-12 DIAGNOSIS — Z9849 Cataract extraction status, unspecified eye: Secondary | ICD-10-CM | POA: Diagnosis not present

## 2018-12-12 DIAGNOSIS — H40053 Ocular hypertension, bilateral: Secondary | ICD-10-CM | POA: Diagnosis not present

## 2018-12-12 DIAGNOSIS — H52223 Regular astigmatism, bilateral: Secondary | ICD-10-CM | POA: Diagnosis not present

## 2018-12-12 DIAGNOSIS — H18893 Other specified disorders of cornea, bilateral: Secondary | ICD-10-CM | POA: Diagnosis not present

## 2018-12-12 DIAGNOSIS — H5213 Myopia, bilateral: Secondary | ICD-10-CM | POA: Diagnosis not present

## 2018-12-12 DIAGNOSIS — H59813 Chorioretinal scars after surgery for detachment, bilateral: Secondary | ICD-10-CM | POA: Diagnosis not present

## 2018-12-12 DIAGNOSIS — Z961 Presence of intraocular lens: Secondary | ICD-10-CM | POA: Diagnosis not present

## 2019-02-14 DIAGNOSIS — H8110 Benign paroxysmal vertigo, unspecified ear: Secondary | ICD-10-CM | POA: Diagnosis not present

## 2019-02-14 DIAGNOSIS — Z6825 Body mass index (BMI) 25.0-25.9, adult: Secondary | ICD-10-CM | POA: Diagnosis not present

## 2019-02-21 DIAGNOSIS — M1711 Unilateral primary osteoarthritis, right knee: Secondary | ICD-10-CM | POA: Diagnosis not present

## 2019-02-27 DIAGNOSIS — Z6826 Body mass index (BMI) 26.0-26.9, adult: Secondary | ICD-10-CM | POA: Diagnosis not present

## 2019-02-27 DIAGNOSIS — H8110 Benign paroxysmal vertigo, unspecified ear: Secondary | ICD-10-CM | POA: Diagnosis not present

## 2019-02-27 DIAGNOSIS — H65192 Other acute nonsuppurative otitis media, left ear: Secondary | ICD-10-CM | POA: Diagnosis not present

## 2019-03-13 ENCOUNTER — Inpatient Hospital Stay: Admit: 2019-03-13 | Payer: Managed Care, Other (non HMO) | Admitting: Orthopedic Surgery

## 2019-03-13 SURGERY — ARTHROPLASTY, KNEE, TOTAL
Anesthesia: Choice | Site: Knee | Laterality: Right

## 2019-04-13 DIAGNOSIS — Z0001 Encounter for general adult medical examination with abnormal findings: Secondary | ICD-10-CM | POA: Diagnosis not present

## 2019-04-17 DIAGNOSIS — M1712 Unilateral primary osteoarthritis, left knee: Secondary | ICD-10-CM | POA: Diagnosis not present

## 2019-04-17 DIAGNOSIS — Z01818 Encounter for other preprocedural examination: Secondary | ICD-10-CM | POA: Diagnosis not present

## 2019-04-17 DIAGNOSIS — M1711 Unilateral primary osteoarthritis, right knee: Secondary | ICD-10-CM | POA: Diagnosis not present

## 2019-04-17 DIAGNOSIS — Z0001 Encounter for general adult medical examination with abnormal findings: Secondary | ICD-10-CM | POA: Diagnosis not present

## 2019-04-17 DIAGNOSIS — Z6826 Body mass index (BMI) 26.0-26.9, adult: Secondary | ICD-10-CM | POA: Diagnosis not present

## 2019-04-17 DIAGNOSIS — N182 Chronic kidney disease, stage 2 (mild): Secondary | ICD-10-CM | POA: Diagnosis not present

## 2019-04-17 DIAGNOSIS — E039 Hypothyroidism, unspecified: Secondary | ICD-10-CM | POA: Diagnosis not present

## 2019-04-17 DIAGNOSIS — K219 Gastro-esophageal reflux disease without esophagitis: Secondary | ICD-10-CM | POA: Diagnosis not present

## 2019-04-17 DIAGNOSIS — R319 Hematuria, unspecified: Secondary | ICD-10-CM | POA: Diagnosis not present

## 2019-04-17 DIAGNOSIS — I10 Essential (primary) hypertension: Secondary | ICD-10-CM | POA: Diagnosis not present

## 2019-04-17 DIAGNOSIS — E782 Mixed hyperlipidemia: Secondary | ICD-10-CM | POA: Diagnosis not present

## 2019-04-17 DIAGNOSIS — Z96652 Presence of left artificial knee joint: Secondary | ICD-10-CM | POA: Diagnosis not present

## 2019-04-17 DIAGNOSIS — F339 Major depressive disorder, recurrent, unspecified: Secondary | ICD-10-CM | POA: Diagnosis not present

## 2019-05-03 DIAGNOSIS — R300 Dysuria: Secondary | ICD-10-CM | POA: Diagnosis not present

## 2019-05-13 DIAGNOSIS — R319 Hematuria, unspecified: Secondary | ICD-10-CM | POA: Diagnosis not present

## 2019-05-19 DIAGNOSIS — I700 Atherosclerosis of aorta: Secondary | ICD-10-CM | POA: Diagnosis not present

## 2019-05-19 DIAGNOSIS — R319 Hematuria, unspecified: Secondary | ICD-10-CM | POA: Diagnosis not present

## 2019-06-01 NOTE — H&P (Signed)
TOTAL KNEE ADMISSION H&P  Patient is being admitted for right total knee arthroplasty.  Subjective:  Chief Complaint: Right knee pain.  HPI: Sheryl Hamilton, 69 y.o. female has a history of pain and functional disability in the right knee due to arthritis and has failed non-surgical conservative treatments for greater than 12 weeks to include corticosteriod injections and activity modification. Onset of symptoms was gradual, starting 5 years ago with gradually worsening course since that time. The patient noted prior procedures on the knee to include  arthroscopy and menisectomy on the right knee.  Patient currently rates pain in the right knee at 7 out of 10 with activity. Patient has worsening of pain with activity and weight bearing, pain that interferes with activities of daily living and instability. Patient has evidence of bone-on-bone arthritis in the medial compartment with osteophyte formation at the medial femoral condyle. Patellofemoral compartment shows narrowing and spurring. Slight varus deformity by imaging studies. There is no active infection.  Patient Active Problem List   Diagnosis Date Noted   OA (osteoarthritis) of knee 06/11/2014   Cough 10/17/2012   HBP (high blood pressure) 10/17/2012   Precordial pain 10/14/2012    Past Medical History:  Diagnosis Date   Allergy    seasonal   Arthritis    Hypertension    Hypothyroid     Past Surgical History:  Procedure Laterality Date   APPENDECTOMY     NECK SURGERY     Pinched Nerve   TONSILLECTOMY     TOTAL KNEE ARTHROPLASTY Left 06/11/2014   Procedure: LEFT TOTAL KNEE ARTHROPLASTY;  Surgeon: Gaynelle Arabian, MD;  Location: [facility];  Service: Orthopedics;  Laterality: Left;   VAGINAL HYSTERECTOMY      Prior to Admission medications   Medication Sig Start Date End Date Taking? Authorizing Provider  aspirin EC 81 MG tablet Take 81 mg by mouth daily.   Yes [provider]  Cholecalciferol (VITAMIN D) 50  MCG (2000 UT) CAPS Take 2,000 Units by mouth daily.   Yes [provider]  levothyroxine (SYNTHROID, LEVOTHROID) 75 MCG tablet Take 75 mcg by mouth daily before breakfast.   Yes [provider]  losartan (COZAAR) 100 MG tablet Take 100 mg by mouth every morning.    Yes [provider]  PARoxetine (PAXIL) 10 MG tablet Take 10 mg by mouth daily.    Yes [provider]  dexlansoprazole (DEXILANT) 60 MG capsule Take 1 capsule (60 mg total) by mouth daily. Patient not taking: Reported on 05/30/2014 02/01/13   Ladene Artist, MD  methocarbamol (ROBAXIN) 500 MG tablet Take 1 tablet (500 mg total) by mouth every 6 (six) hours as needed for muscle spasms. Patient not taking: Reported on 05/30/2019 06/13/14   Dara Lords, Alexzandrew L, PA-C  ondansetron (ZOFRAN) 4 MG tablet Take 1 tablet (4 mg total) by mouth every 6 (six) hours as needed for nausea. Patient not taking: Reported on 05/30/2019 06/13/14   Dara Lords, Alexzandrew L, PA-C  oxyCODONE (OXY IR/ROXICODONE) 5 MG immediate release tablet Take 1-2 tablets (5-10 mg total) by mouth every 3 (three) hours as needed for moderate pain, severe pain or breakthrough pain. Patient not taking: Reported on 05/30/2019 06/13/14   Dara Lords, Alexzandrew L, PA-C  rivaroxaban (XARELTO) 10 MG TABS tablet Take 1 tablet (10 mg total) by mouth daily with breakfast. Take Xarelto for two and a half more weeks, then discontinue Xarelto. Once the patient has completed the blood thinner regimen, then take a Baby 81 mg  Aspirin daily for three more weeks. Patient not taking: Reported on 05/30/2019 06/13/14   Dara Lords, Alexzandrew L, PA-C  traMADol (ULTRAM) 50 MG tablet Take 1-2 tablets (50-100 mg total) by mouth every 6 (six) hours as needed (mild pain). Patient not taking: Reported on 05/30/2019 06/13/14   Dara Lords, Alexzandrew L, PA-C    No Known Allergies  Social History   Socioeconomic History   Marital status: Divorced    Spouse name: Not on file    Number of children: 1   Years of education: Not on file   Highest education level: Not on file  Occupational History   Occupation: [employer]    Comment: Clerical work   Tobacco Use   Smoking status: Never Smoker   Smokeless tobacco: Never Used  Substance and Sexual Activity   Alcohol use: Yes    Comment: occasionally   Drug use: No   Sexual activity: Not on file  Other Topics Concern   Not on file  Social History Narrative   Lives alone.    Social Determinants of Health   Financial Resource Strain:    Difficulty of Paying Living Expenses:   Food Insecurity:    Worried About Charity fundraiser in the Last Year:    Arboriculturist in the Last Year:   Transportation Needs:    Film/video editor (Medical):    Lack of Transportation (Non-Medical):   Physical Activity:    Days of Exercise per Week:    Minutes of Exercise per Session:   Stress:    Feeling of Stress :   Social Connections:    Frequency of Communication with Friends and Family:    Frequency of Social Gatherings with Friends and Family:    Attends Religious Services:    Active Member of Clubs or Organizations:    Attends Archivist Meetings:    Marital Status:   Intimate Partner Violence:    Fear of Current or Ex-Partner:    Emotionally Abused:    Physically Abused:    Sexually Abused:       Tobacco Use:    Smoking Tobacco Use:    Smokeless Tobacco Use:    Social History   Substance and Sexual Activity  Alcohol Use Yes   Comment: occasionally    Family History  Problem Relation Age of Onset   Cancer Sister        Ovarian   Cancer Mother        Breast   Diabetes Sister    Irritable bowel syndrome Sister    Esophageal cancer Neg Hx    Stomach cancer Neg Hx     Review of Systems  Constitutional: Negative for chills and fever.  HENT: Negative for congestion, sore throat and tinnitus.   Eyes: Negative for double vision, photophobia and  pain.  Respiratory: Negative for cough, shortness of breath and wheezing.   Cardiovascular: Negative for chest pain, palpitations and orthopnea.  Gastrointestinal: Negative for heartburn, nausea and vomiting.  Genitourinary: Negative for dysuria, frequency and urgency.  Musculoskeletal: Positive for joint pain.  Neurological: Negative for dizziness, weakness and headaches.    Objective:  Physical Exam: Well nourished and well developed.  General: Alert and oriented x3, cooperative and pleasant, no acute distress.  Head: normocephalic, atraumatic, neck supple.  Eyes: EOMI.  Respiratory: breath sounds clear in all fields, no wheezing, rales, or rhonchi. Cardiovascular: Regular rate and rhythm, no murmurs, gallops or rubs.  Abdomen: non-tender to palpation and soft,  normoactive bowel sounds. Musculoskeletal:  Right Knee: No tenderness to palpation about the medial or lateral joint line of the right knee. AROM 0-125 degrees. No effusion noted. No instability. Mild patellofemoral crepitus. Slight varus deformity.  Calves soft and nontender. Motor function intact in LE. Strength 5/5 LE bilaterally. Neuro: Distal pulses 2+. Sensation to light touch intact in LE.  Imaging Review Plain radiographs demonstrate severe degenerative joint disease of the right knee. The overall alignment is mild varus. The bone quality appears to be adequate for age and reported activity level.  Assessment/Plan:  End stage arthritis, right knee   The patient history, physical examination, clinical judgment of the provider and imaging studies are consistent with end stage degenerative joint disease of the right knee and total knee arthroplasty is deemed medically necessary. The treatment options including medical management, injection therapy arthroscopy and arthroplasty were discussed at length. The risks and benefits of total knee arthroplasty were presented and reviewed. The risks due to aseptic loosening,  infection, stiffness, patella tracking problems, thromboembolic complications and other imponderables were discussed. The patient acknowledged the explanation, agreed to proceed with the plan and consent was signed. Patient is being admitted for inpatient treatment for surgery, pain control, PT, OT, prophylactic antibiotics, VTE prophylaxis, progressive ambulation and ADLs and discharge planning. The patient is planning to be discharged home.   Patient's anticipated LOS is less than 2 midnights, meeting these requirements: - Younger than 83 - Lives within 1 hour of care - Has a competent adult at home to recover with post-op recover - NO history of  - Chronic pain requiring opiods  - Diabetes  - Coronary Artery Disease  - Heart failure  - Heart attack  - Stroke  - DVT/VTE  - Cardiac arrhythmia  - Respiratory Failure/COPD  - Renal failure  - Anemia  - Advanced Liver disease  Therapy Plans: Outpatient therapy at [facility] in [place] Disposition: Home with sister Planned DVT Prophylaxis: Aspirin 325 mg BID DME Needed: None PCP: Judd Lien, MD (clearance provided) TXA: IV Allergies: NKDA Anesthesia Concerns: Nausea BMI: 26.2  Other:  - Plan for 23 hour observation - Currently being worked up for recent hematuria. Appt with Dr. Gloriann Loan scheduled for 5/4  - Patient was instructed on what medications to stop prior to surgery. - Follow-up visit in 2 weeks with Dr. Wynelle Link - Begin physical therapy following surgery - Pre-operative lab work as pre-surgical testing - Prescriptions will be provided in hospital at time of discharge  Theresa Duty, PA-C Orthopedic Surgery [facility] [place]

## 2019-06-12 ENCOUNTER — Encounter (HOSPITAL_COMMUNITY): Payer: PPO

## 2019-06-12 ENCOUNTER — Encounter (HOSPITAL_COMMUNITY): Admission: RE | Admit: 2019-06-12 | Payer: PPO | Source: Ambulatory Visit

## 2019-06-13 DIAGNOSIS — R3121 Asymptomatic microscopic hematuria: Secondary | ICD-10-CM | POA: Diagnosis not present

## 2019-06-19 ENCOUNTER — Inpatient Hospital Stay (HOSPITAL_COMMUNITY): Admission: RE | Admit: 2019-06-19 | Payer: PPO | Source: Ambulatory Visit | Admitting: Orthopedic Surgery

## 2019-06-19 ENCOUNTER — Encounter (HOSPITAL_COMMUNITY): Admission: RE | Payer: Self-pay | Source: Ambulatory Visit

## 2019-06-19 SURGERY — ARTHROPLASTY, KNEE, TOTAL
Anesthesia: Choice | Site: Knee | Laterality: Right

## 2019-07-31 DIAGNOSIS — M1712 Unilateral primary osteoarthritis, left knee: Secondary | ICD-10-CM | POA: Diagnosis not present

## 2019-07-31 DIAGNOSIS — R319 Hematuria, unspecified: Secondary | ICD-10-CM | POA: Diagnosis not present

## 2019-07-31 DIAGNOSIS — M19041 Primary osteoarthritis, right hand: Secondary | ICD-10-CM | POA: Diagnosis not present

## 2019-07-31 DIAGNOSIS — M1711 Unilateral primary osteoarthritis, right knee: Secondary | ICD-10-CM | POA: Diagnosis not present

## 2019-07-31 DIAGNOSIS — Z01818 Encounter for other preprocedural examination: Secondary | ICD-10-CM | POA: Diagnosis not present

## 2019-07-31 DIAGNOSIS — N182 Chronic kidney disease, stage 2 (mild): Secondary | ICD-10-CM | POA: Diagnosis not present

## 2019-08-04 DIAGNOSIS — E039 Hypothyroidism, unspecified: Secondary | ICD-10-CM | POA: Diagnosis not present

## 2019-08-04 DIAGNOSIS — Z96652 Presence of left artificial knee joint: Secondary | ICD-10-CM | POA: Diagnosis not present

## 2019-08-04 DIAGNOSIS — N182 Chronic kidney disease, stage 2 (mild): Secondary | ICD-10-CM | POA: Diagnosis not present

## 2019-08-04 DIAGNOSIS — R319 Hematuria, unspecified: Secondary | ICD-10-CM | POA: Diagnosis not present

## 2019-08-04 DIAGNOSIS — I4891 Unspecified atrial fibrillation: Secondary | ICD-10-CM | POA: Diagnosis not present

## 2019-08-04 DIAGNOSIS — I10 Essential (primary) hypertension: Secondary | ICD-10-CM | POA: Diagnosis not present

## 2019-08-04 DIAGNOSIS — E782 Mixed hyperlipidemia: Secondary | ICD-10-CM | POA: Diagnosis not present

## 2019-08-04 DIAGNOSIS — Z01818 Encounter for other preprocedural examination: Secondary | ICD-10-CM | POA: Diagnosis not present

## 2019-08-22 NOTE — Patient Instructions (Addendum)
DUE TO COVID-19 ONLY ONE VISITOR IS ALLOWED TO COME WITH YOU AND STAY IN THE WAITING ROOM ONLY DURING PRE OP AND PROCEDURE DAY OF SURGERY. THE 1 VISITOR MAY VISIT WITH YOU AFTER SURGERY IN YOUR PRIVATE ROOM DURING VISITING HOURS ONLY!                 Sheryl Hamilton   Your procedure is scheduled on: 09/04/19   Report to [facility] Main  Entrance   Report to admitting at: 6:00 AM     Call this number if you have problems the morning of surgery [phone]    Remember:   NO SOLID FOOD AFTER MIDNIGHT THE NIGHT PRIOR TO SURGERY. NOTHING BY MOUTH EXCEPT CLEAR LIQUIDS UNTIL: 5:20 am . PLEASE FINISH ENSURE DRINK PER SURGEON ORDER  WHICH NEEDS TO BE COMPLETED AT: 5:20 am .   CLEAR LIQUID DIET   Foods Allowed                                                                     Foods Excluded  Coffee and tea, regular and decaf                             liquids that you cannot  Plain Jell-O any favor except red or purple                                           see through such as: Fruit ices (not with fruit pulp)                                     milk, soups, orange juice  Iced Popsicles                                    All solid food Carbonated beverages, regular and diet                                    Cranberry, grape and apple juices Sports drinks like Gatorade Lightly seasoned clear broth or consume(fat free) Sugar, honey syrup  Sample Menu Breakfast                                Lunch                                     Supper Cranberry juice                    Beef broth                            Chicken broth Jell-O  Grape juice                           Apple juice Coffee or tea                        Jell-O                                      Popsicle                                                Coffee or tea                        Coffee or  tea  _____________________________________________________________________  BRUSH YOUR TEETH MORNING OF SURGERY AND RINSE YOUR MOUTH OUT, NO CHEWING GUM CANDY OR MINTS.     Take these medicines the morning of surgery with A SIP OF WATER: levothyroxine,paroxetine                                You may not have any metal on your body including hair pins and              piercings  Do not wear jewelry, make-up, lotions, powders or perfumes, deodorant             Do not wear nail polish on your fingernails.  Do not shave  48 hours prior to surgery.                 Do not bring valuables to the hospital. [place].  Contacts, dentures or bridgework may not be worn into surgery.  Leave suitcase in the car. After surgery it may be brought to your room.     Patients discharged the day of surgery will not be allowed to drive home. IF YOU ARE HAVING SURGERY AND GOING HOME THE SAME DAY, YOU MUST HAVE AN ADULT TO DRIVE YOU HOME AND BE WITH YOU FOR 24 HOURS. YOU MAY GO HOME BY TAXI OR UBER OR ORTHERWISE, BUT AN ADULT MUST ACCOMPANY YOU HOME AND STAY WITH YOU FOR 24 HOURS.  Name and phone number of your driver:  Special Instructions: N/A              Please read over the following fact sheets you were given: _____________________________________________________________________     [facility] - Preparing for Surgery Before surgery, you can play an important role.  Because skin is not sterile, your skin needs to be as free of germs as possible.  You can reduce the number of germs on your skin by washing with CHG (chlorahexidine gluconate) soap before surgery.  CHG is an antiseptic cleaner which kills germs and bonds with the skin to continue killing germs even after washing. Please DO NOT use if you have an allergy to CHG or antibacterial soaps.  If your skin becomes reddened/irritated stop using the CHG and inform your nurse when you arrive at Short  Stay. Do not shave (including legs and underarms)  for at least 48 hours prior to the first CHG shower.  You may shave your face/neck. Please follow these instructions carefully:  1.  Shower with CHG Soap the night before surgery and the  morning of Surgery.  2.  If you choose to wash your hair, wash your hair first as usual with your  normal  shampoo.  3.  After you shampoo, rinse your hair and body thoroughly to remove the  shampoo.                           4.  Use CHG as you would any other liquid soap.  You can apply chg directly  to the skin and wash                       Gently with a scrungie or clean washcloth.  5.  Apply the CHG Soap to your body ONLY FROM THE NECK DOWN.   Do not use on face/ open                           Wound or open sores. Avoid contact with eyes, ears mouth and genitals (private parts).                       Wash face,  Genitals (private parts) with your normal soap.             6.  Wash thoroughly, paying special attention to the area where your surgery  will be performed.  7.  Thoroughly rinse your body with warm water from the neck down.  8.  DO NOT shower/wash with your normal soap after using and rinsing off  the CHG Soap.                9.  Pat yourself dry with a clean towel.            10.  Wear clean pajamas.            11.  Place clean sheets on your bed the night of your first shower and do not  sleep with pets. Day of Surgery : Do not apply any lotions/deodorants the morning of surgery.  Please wear clean clothes to the hospital/surgery center.  FAILURE TO FOLLOW THESE INSTRUCTIONS MAY RESULT IN THE CANCELLATION OF YOUR SURGERY PATIENT SIGNATURE_________________________________  NURSE SIGNATURE__________________________________  ________________________________________________________________________   Sheryl Hamilton  An incentive spirometer is a tool that can help keep your lungs clear and active. This tool measures how well you are  filling your lungs with each breath. Taking long deep breaths may help reverse or decrease the chance of developing breathing (pulmonary) problems (especially infection) following:  A long period of time when you are unable to move or be active. BEFORE THE PROCEDURE   If the spirometer includes an indicator to show your best effort, your nurse or respiratory therapist will set it to a desired goal.  If possible, sit up straight or lean slightly forward. Try not to slouch.  Hold the incentive spirometer in an upright position. INSTRUCTIONS FOR USE  1. Sit on the edge of your bed if possible, or sit up as far as you can in bed or on a chair. 2. Hold the incentive spirometer in an upright position. 3. Breathe out normally. 4. Place the mouthpiece in your mouth and seal your lips tightly  around it. 5. Breathe in slowly and as deeply as possible, raising the piston or the ball toward the top of the column. 6. Hold your breath for 3-5 seconds or for as long as possible. Allow the piston or ball to fall to the bottom of the column. 7. Remove the mouthpiece from your mouth and breathe out normally. 8. Rest for a few seconds and repeat Steps 1 through 7 at least 10 times every 1-2 hours when you are awake. Take your time and take a few normal breaths between deep breaths. 9. The spirometer may include an indicator to show your best effort. Use the indicator as a goal to work toward during each repetition. 10. After each set of 10 deep breaths, practice coughing to be sure your lungs are clear. If you have an incision (the cut made at the time of surgery), support your incision when coughing by placing a pillow or rolled up towels firmly against it. Once you are able to get out of bed, walk around indoors and cough well. You may stop using the incentive spirometer when instructed by your caregiver.  RISKS AND COMPLICATIONS  Take your time so you do not get dizzy or light-headed.  If you are in pain,  you may need to take or ask for pain medication before doing incentive spirometry. It is harder to take a deep breath if you are having pain. AFTER USE  Rest and breathe slowly and easily.  It can be helpful to keep track of a log of your progress. Your caregiver can provide you with a simple table to help with this. If you are using the spirometer at home, follow these instructions: [place] IF:   You are having difficultly using the spirometer.  You have trouble using the spirometer as often as instructed.  Your pain medication is not giving enough relief while using the spirometer.  You develop fever of 100.5 F (38.1 C) or higher. SEEK IMMEDIATE MEDICAL CARE IF:   You cough up bloody sputum that had not been present before.  You develop fever of 102 F (38.9 C) or greater.  You develop worsening pain at or near the incision site. MAKE SURE YOU:   Understand these instructions.  Will watch your condition.  Will get help right away if you are not doing well or get worse. Document Released: 06/08/2006 Document Revised: 04/20/2011 Document Reviewed: 08/09/2006 [facility] Patient Information 2014 [place], [place].   ________________________________________________________________________

## 2019-08-23 ENCOUNTER — Other Ambulatory Visit: Payer: Self-pay

## 2019-08-23 ENCOUNTER — Encounter (HOSPITAL_COMMUNITY): Payer: Self-pay

## 2019-08-23 ENCOUNTER — Encounter (HOSPITAL_COMMUNITY)
Admission: RE | Admit: 2019-08-23 | Discharge: 2019-08-23 | Disposition: A | Discharge status: Home / Self Care | Payer: PPO | Source: Ambulatory Visit | Attending: Orthopedic Surgery | Admitting: Orthopedic Surgery

## 2019-08-23 DIAGNOSIS — Z01818 Encounter for other preprocedural examination: Secondary | ICD-10-CM | POA: Diagnosis not present

## 2019-08-23 HISTORY — DX: Other specified postprocedural states: R11.2

## 2019-08-23 HISTORY — DX: Other specified postprocedural states: Z98.890

## 2019-08-23 HISTORY — DX: Nausea with vomiting, unspecified: R11.2

## 2019-08-23 HISTORY — DX: Other complications of anesthesia, initial encounter: T88.59XA

## 2019-08-23 LAB — COMPREHENSIVE METABOLIC PANEL
ALT: 18 U/L (ref 0–44)
AST: 22 U/L (ref 15–41)
Albumin: 4.3 g/dL (ref 3.5–5.0)
Alkaline Phosphatase: 61 U/L (ref 38–126)
Anion gap: 12 (ref 5–15)
BUN: 10 mg/dL (ref 8–23)
CO2: 27 mmol/L (ref 22–32)
Calcium: 9.3 mg/dL (ref 8.9–10.3)
Chloride: 107 mmol/L (ref 98–111)
Creatinine, Ser: 0.98 mg/dL (ref 0.44–1.00)
GFR calc Af Amer: >60 mL/min (ref >60)
GFR calc non Af Amer: 58 mL/min — ABNORMAL LOW (ref >60)
Glucose, Bld: 108 mg/dL — ABNORMAL HIGH (ref 70–99)
Potassium: 4.4 mmol/L (ref 3.5–5.1)
Sodium: 146 mmol/L — ABNORMAL HIGH (ref 135–145)
Total Bilirubin: 0.6 mg/dL (ref 0.3–1.2)
Total Protein: 7.2 g/dL (ref 6.5–8.1)

## 2019-08-23 LAB — CBC
HCT: 44.1 % (ref 36.0–46.0)
Hemoglobin: 14.4 g/dL (ref 12.0–15.0)
MCH: 30.5 pg (ref 26.0–34.0)
MCHC: 32.7 g/dL (ref 30.0–36.0)
MCV: 93.4 fL (ref 80.0–100.0)
Platelets: 262 10*3/uL (ref 150–400)
RBC: 4.72 MIL/uL (ref 3.87–5.11)
RDW: 14.1 % (ref 11.5–15.5)
WBC: 6.7 10*3/uL (ref 4.0–10.5)
nRBC: 0 % (ref 0.0–0.2)

## 2019-08-23 LAB — SURGICAL PCR SCREEN
MRSA, PCR: NEGATIVE
Staphylococcus aureus: NEGATIVE

## 2019-08-23 LAB — PROTIME-INR
INR: 1 (ref 0.8–1.2)
Prothrombin Time: 12.9 seconds (ref 11.4–15.2)

## 2019-08-23 LAB — APTT: aPTT: 30 seconds (ref 24–36)

## 2019-08-23 NOTE — Progress Notes (Signed)
COVID Vaccine Completed:yes Date COVID Vaccine completed:03/29/19 COVID vaccine manufacturer: Pfizer    *[employer]   PCP -  Dr. Judd Lien. LOV: 02/27/19 Cardiologist - No  Chest x-ray -  EKG -  Stress Test -  ECHO -  Cardiac Cath -   Sleep Study -  CPAP -   Fasting Blood Sugar -  Checks Blood Sugar _____ times a day  Blood Thinner Instructions: Aspirin Instructions: Last Dose:  Anesthesia review:   Patient denies shortness of breath, fever, cough and chest pain at PAT appointment   Patient verbalized understanding of instructions that were given to them at the PAT appointment. Patient was also instructed that they will need to review over the PAT instructions again at home before surgery.

## 2019-08-29 NOTE — H&P (Signed)
TOTAL KNEE ADMISSION H&P  Patient is being admitted for right total knee arthroplasty.  Subjective:  Chief Complaint: Right knee pain.  HPI: Sheryl Hamilton, 69 y.o. female has a history of pain and functional disability in the right knee due to arthritis and has failed non-surgical conservative treatments for greater than 12 weeks to include corticosteriod injections and activity modification. Onset of symptoms was gradual, starting several years ago with gradually worsening course since that time. The patient noted prior procedures on the knee to include  arthroscopy and menisectomy on the right knee.  Patient currently rates pain in the right knee at 7 out of 10 with activity. Patient has worsening of pain with activity and weight bearing, pain that interferes with activities of daily living, crepitus and instability. Patient has evidence of one-on-bone arthritis in the medial compartment with osteophyte formation at the medial femoral condyle. Patellofemoral compartment shows narrowing and spurring. Slight varus deformity by imaging studies. There is no active infection.  Patient Active Problem List   Diagnosis Date Noted  . OA (osteoarthritis) of knee 06/11/2014  . Cough 10/17/2012  . HBP (high blood pressure) 10/17/2012  . Precordial pain 10/14/2012    Past Medical History:  Diagnosis Date  . Allergy    seasonal  . Arthritis   . Complication of anesthesia   . Hypertension   . Hypothyroid   . PONV (postoperative nausea and vomiting)     Past Surgical History:  Procedure Laterality Date  . APPENDECTOMY    . NECK SURGERY     Pinched Nerve  . TONSILLECTOMY    . TOTAL KNEE ARTHROPLASTY Left 06/11/2014   Procedure: LEFT TOTAL KNEE ARTHROPLASTY;  Surgeon: Gaynelle Arabian, MD;  Location: [facility];  Service: Orthopedics;  Laterality: Left;  Marland Kitchen VAGINAL HYSTERECTOMY      Prior to Admission medications   Medication Sig Start Date End Date Taking? Authorizing Provider  aspirin EC 81 MG  tablet Take 81 mg by mouth daily.   Yes [provider]  Cholecalciferol (VITAMIN D) 50 MCG (2000 UT) CAPS Take 2,000 Units by mouth daily.   Yes [provider]  levothyroxine (SYNTHROID, LEVOTHROID) 75 MCG tablet Take 75 mcg by mouth daily before breakfast.   Yes [provider]  lisinopril (ZESTRIL) 40 MG tablet Take 40 mg by mouth daily.   Yes [provider]  PARoxetine (PAXIL) 10 MG tablet Take 10 mg by mouth daily.    Yes [provider]  dexlansoprazole (DEXILANT) 60 MG capsule Take 1 capsule (60 mg total) by mouth daily. Patient not taking: Reported on 05/30/2014 02/01/13   Ladene Artist, MD  losartan (COZAAR) 100 MG tablet Take 100 mg by mouth every morning.  Patient not taking: Reported on 08/10/2019    [provider]  methocarbamol (ROBAXIN) 500 MG tablet Take 1 tablet (500 mg total) by mouth every 6 (six) hours as needed for muscle spasms. Patient not taking: Reported on 05/30/2019 06/13/14   Dara Lords, Alexzandrew L, PA-C  ondansetron (ZOFRAN) 4 MG tablet Take 1 tablet (4 mg total) by mouth every 6 (six) hours as needed for nausea. Patient not taking: Reported on 05/30/2019 06/13/14   Dara Lords, Alexzandrew L, PA-C  oxyCODONE (OXY IR/ROXICODONE) 5 MG immediate release tablet Take 1-2 tablets (5-10 mg total) by mouth every 3 (three) hours as needed for moderate pain, severe pain or breakthrough pain. Patient not taking: Reported on 05/30/2019 06/13/14   Dara Lords, Alexzandrew L, PA-C  rivaroxaban (XARELTO) 10 MG TABS  tablet Take 1 tablet (10 mg total) by mouth daily with breakfast. Take Xarelto for two and a half more weeks, then discontinue Xarelto. Once the patient has completed the blood thinner regimen, then take a Baby 81 mg Aspirin daily for three more weeks. Patient not taking: Reported on 05/30/2019 06/13/14   Dara Lords, Alexzandrew L, PA-C  traMADol (ULTRAM) 50 MG tablet Take 1-2 tablets (50-100 mg total) by mouth every 6 (six) hours as  needed (mild pain). Patient not taking: Reported on 05/30/2019 06/13/14   Dara Lords, Alexzandrew L, PA-C    No Known Allergies  Social History   Socioeconomic History  . Marital status: Divorced    Spouse name: Not on file  . Number of children: 1  . Years of education: Not on file  . Highest education level: Not on file  Occupational History  . Occupation: [employer]    Comment: Clerical work   Tobacco Use  . Smoking status: Never Smoker  . Smokeless tobacco: Never Used  Vaping Use  . Vaping Use: Never used  Substance and Sexual Activity  . Alcohol use: Yes    Comment: occasionally  . Drug use: No  . Sexual activity: Not on file  Other Topics Concern  . Not on file  Social History Narrative   Lives alone.    Social Determinants of Health   Financial Resource Strain:   . Difficulty of Paying Living Expenses:   Food Insecurity:   . Worried About Charity fundraiser in the Last Year:   . Arboriculturist in the Last Year:   Transportation Needs:   . Film/video editor (Medical):   Marland Kitchen Lack of Transportation (Non-Medical):   Physical Activity:   . Days of Exercise per Week:   . Minutes of Exercise per Session:   Stress:   . Feeling of Stress :   Social Connections:   . Frequency of Communication with Friends and Family:   . Frequency of Social Gatherings with Friends and Family:   . Attends Religious Services:   . Active Member of Clubs or Organizations:   . Attends Archivist Meetings:   Marland Kitchen Marital Status:   Intimate Partner Violence:   . Fear of Current or Ex-Partner:   . Emotionally Abused:   Marland Kitchen Physically Abused:   . Sexually Abused:       Tobacco Use: Low Risk   . Smoking Tobacco Use: Never Smoker  . Smokeless Tobacco Use: Never Used   Social History   Substance and Sexual Activity  Alcohol Use Yes   Comment: occasionally    Family History  Problem Relation Age of Onset  . Cancer Sister        Ovarian  . Cancer Mother        Breast   . Diabetes Sister   . Irritable bowel syndrome Sister   . Esophageal cancer Neg Hx   . Stomach cancer Neg Hx     Review of Systems  Constitutional: Negative for chills and fever.  HENT: Negative for congestion, sore throat and tinnitus.   Eyes: Negative for double vision, photophobia and pain.  Respiratory: Negative for cough, shortness of breath and wheezing.   Cardiovascular: Negative for chest pain, palpitations and orthopnea.  Gastrointestinal: Negative for heartburn, nausea and vomiting.  Genitourinary: Negative for dysuria, frequency and urgency.  Musculoskeletal: Positive for joint pain.  Neurological: Negative for dizziness, weakness and headaches.    Objective:  Physical Exam: Well nourished and well  developed.  General: Alert and oriented x3, cooperative and pleasant, no acute distress.  Head: normocephalic, atraumatic, neck supple.  Eyes: EOMI.  Respiratory: breath sounds clear in all fields, no wheezing, rales, or rhonchi. Cardiovascular: Regular rate and rhythm, no murmurs, gallops or rubs.  Abdomen: non-tender to palpation and soft, normoactive bowel sounds. Musculoskeletal:  Right Knee: No tenderness to palpation about the medial or lateral joint line of the right knee. AROM 0-125 degrees. No effusion noted. No instability. Mild patellofemoral crepitus. Slight varus deformity.  Calves soft and nontender. Motor function intact in LE. Strength 5/5 LE bilaterally. Neuro: Distal pulses 2+. Sensation to light touch intact in LE.  Imaging Review Plain radiographs demonstrate severe degenerative joint disease of the right knee. The overall alignment is mild varus. The bone quality appears to be adequate for age and reported activity level.  Assessment/Plan:  End stage arthritis, right knee   The patient history, physical examination, clinical judgment of the provider and imaging studies are consistent with end stage degenerative joint disease of the right knee and  total knee arthroplasty is deemed medically necessary. The treatment options including medical management, injection therapy arthroscopy and arthroplasty were discussed at length. The risks and benefits of total knee arthroplasty were presented and reviewed. The risks due to aseptic loosening, infection, stiffness, patella tracking problems, thromboembolic complications and other imponderables were discussed. The patient acknowledged the explanation, agreed to proceed with the plan and consent was signed. Patient is being admitted for inpatient treatment for surgery, pain control, PT, OT, prophylactic antibiotics, VTE prophylaxis, progressive ambulation and ADLs and discharge planning. The patient is planning to be discharged home.   Patient's anticipated LOS is less than 2 midnights, meeting these requirements: - Lives within 1 hour of care - Has a competent adult at home to recover with post-op recover - NO history of  - Chronic pain requiring opioids  - Diabetes  - Coronary Artery Disease  - Heart failure  - Heart attack  - Stroke  - DVT/VTE  - Cardiac arrhythmia  - Respiratory Failure/COPD  - Renal failure  - Anemia  - Advanced Liver disease  Therapy Plans: Outpatient therapy at [facility] Disposition: Home with daughter Planned DVT Prophylaxis: Aspirin 325 mg BID DME Needed: None PCP: Judd Lien, MD (clearance provided) TXA: IV Allergies: NKDA Anesthesia Concerns: Nausea BMI: 26.2 Last HgbA1c: Not diabetic  Other: - Pharmacy: [facility] in [place] on [address]  - Patient was instructed on what medications to stop prior to surgery. - Follow-up visit in 2 weeks with Dr. Wynelle Link - Begin physical therapy following surgery - Pre-operative lab work as pre-surgical testing - Prescriptions will be provided in hospital at time of discharge  Theresa Duty, PA-C Orthopedic Surgery [facility] [place]

## 2019-09-02 ENCOUNTER — Other Ambulatory Visit (HOSPITAL_COMMUNITY)
Admission: RE | Admit: 2019-09-02 | Discharge: 2019-09-02 | Disposition: A | Discharge status: Home / Self Care | Payer: PPO | Source: Ambulatory Visit | Attending: Orthopedic Surgery | Admitting: Orthopedic Surgery

## 2019-09-02 DIAGNOSIS — Z96652 Presence of left artificial knee joint: Secondary | ICD-10-CM | POA: Diagnosis not present

## 2019-09-02 DIAGNOSIS — Z96651 Presence of right artificial knee joint: Secondary | ICD-10-CM | POA: Diagnosis not present

## 2019-09-02 DIAGNOSIS — M25761 Osteophyte, right knee: Secondary | ICD-10-CM | POA: Diagnosis not present

## 2019-09-02 DIAGNOSIS — E039 Hypothyroidism, unspecified: Secondary | ICD-10-CM | POA: Diagnosis not present

## 2019-09-02 DIAGNOSIS — M1711 Unilateral primary osteoarthritis, right knee: Secondary | ICD-10-CM | POA: Diagnosis not present

## 2019-09-02 DIAGNOSIS — G8918 Other acute postprocedural pain: Secondary | ICD-10-CM | POA: Diagnosis not present

## 2019-09-02 DIAGNOSIS — Z7982 Long term (current) use of aspirin: Secondary | ICD-10-CM | POA: Diagnosis not present

## 2019-09-02 DIAGNOSIS — Z20822 Contact with and (suspected) exposure to covid-19: Secondary | ICD-10-CM | POA: Diagnosis not present

## 2019-09-02 DIAGNOSIS — Z79890 Hormone replacement therapy (postmenopausal): Secondary | ICD-10-CM | POA: Diagnosis not present

## 2019-09-02 DIAGNOSIS — M25561 Pain in right knee: Secondary | ICD-10-CM | POA: Diagnosis present

## 2019-09-02 DIAGNOSIS — Z79899 Other long term (current) drug therapy: Secondary | ICD-10-CM | POA: Diagnosis not present

## 2019-09-02 DIAGNOSIS — I10 Essential (primary) hypertension: Secondary | ICD-10-CM | POA: Diagnosis not present

## 2019-09-02 DIAGNOSIS — Z01812 Encounter for preprocedural laboratory examination: Secondary | ICD-10-CM | POA: Insufficient documentation

## 2019-09-02 LAB — SARS CORONAVIRUS 2 (TAT 6-24 HRS): SARS Coronavirus 2: NEGATIVE

## 2019-09-03 MED ORDER — BUPIVACAINE LIPOSOME 1.3 % IJ SUSP
20.0000 mL | Freq: Once | INTRAMUSCULAR | Status: DC
  Filled 2019-09-03: qty 20

## 2019-09-04 ENCOUNTER — Other Ambulatory Visit: Payer: Self-pay

## 2019-09-04 ENCOUNTER — Encounter (HOSPITAL_COMMUNITY): Payer: Self-pay | Admitting: Orthopedic Surgery

## 2019-09-04 ENCOUNTER — Encounter (HOSPITAL_COMMUNITY)
Admission: RE | Disposition: A | Discharge status: Home / Self Care | Payer: Self-pay | Source: Home / Self Care | Attending: Orthopedic Surgery

## 2019-09-04 ENCOUNTER — Inpatient Hospital Stay (HOSPITAL_COMMUNITY): Payer: PPO | Admitting: Certified Registered Nurse Anesthetist

## 2019-09-04 ENCOUNTER — Inpatient Hospital Stay (HOSPITAL_COMMUNITY)
Admission: RE | Admit: 2019-09-04 | Discharge: 2019-09-05 | DRG: 470 | Disposition: A | Discharge status: Home / Self Care | Payer: PPO | Attending: Orthopedic Surgery | Admitting: Orthopedic Surgery

## 2019-09-04 DIAGNOSIS — Z20822 Contact with and (suspected) exposure to covid-19: Secondary | ICD-10-CM | POA: Diagnosis present

## 2019-09-04 DIAGNOSIS — Z7982 Long term (current) use of aspirin: Secondary | ICD-10-CM | POA: Diagnosis not present

## 2019-09-04 DIAGNOSIS — Z79899 Other long term (current) drug therapy: Secondary | ICD-10-CM | POA: Diagnosis not present

## 2019-09-04 DIAGNOSIS — M25761 Osteophyte, right knee: Secondary | ICD-10-CM | POA: Diagnosis present

## 2019-09-04 DIAGNOSIS — E039 Hypothyroidism, unspecified: Secondary | ICD-10-CM | POA: Diagnosis present

## 2019-09-04 DIAGNOSIS — Z79890 Hormone replacement therapy (postmenopausal): Secondary | ICD-10-CM | POA: Diagnosis not present

## 2019-09-04 DIAGNOSIS — M1711 Unilateral primary osteoarthritis, right knee: Principal | ICD-10-CM | POA: Diagnosis present

## 2019-09-04 DIAGNOSIS — I10 Essential (primary) hypertension: Secondary | ICD-10-CM | POA: Diagnosis present

## 2019-09-04 DIAGNOSIS — Z96652 Presence of left artificial knee joint: Secondary | ICD-10-CM | POA: Diagnosis present

## 2019-09-04 DIAGNOSIS — M25561 Pain in right knee: Secondary | ICD-10-CM | POA: Diagnosis present

## 2019-09-04 HISTORY — PX: TOTAL KNEE ARTHROPLASTY: SHX125

## 2019-09-04 LAB — TYPE AND SCREEN
ABO/RH(D): O POS
Antibody Screen: NEGATIVE

## 2019-09-04 SURGERY — ARTHROPLASTY, KNEE, TOTAL
Anesthesia: Spinal | Site: Knee | Laterality: Right

## 2019-09-04 MED ORDER — ONDANSETRON HCL 4 MG PO TABS: 4.0000 mg | ORAL_TABLET | Freq: Four times a day (QID) | ORAL | Status: DC | PRN

## 2019-09-04 MED ORDER — PHENOL 1.4 % MT LIQD: 1.0000 | OROMUCOSAL | Status: DC | PRN

## 2019-09-04 MED ORDER — CEFAZOLIN SODIUM-DEXTROSE 2-4 GM/100ML-% IV SOLN
2.0000 g | INTRAVENOUS | Status: AC
  Administered 2019-09-04: 2 g via INTRAVENOUS
  Filled 2019-09-04: qty 100

## 2019-09-04 MED ORDER — FLEET ENEMA 7-19 GM/118ML RE ENEM: 1.0000 | ENEMA | Freq: Once | RECTAL | Status: DC | PRN

## 2019-09-04 MED ORDER — ACETAMINOPHEN 10 MG/ML IV SOLN
1000.0000 mg | Freq: Four times a day (QID) | INTRAVENOUS | Status: DC
  Administered 2019-09-04: 1000 mg via INTRAVENOUS
  Filled 2019-09-04: qty 100

## 2019-09-04 MED ORDER — SODIUM CHLORIDE (PF) 0.9 % IJ SOLN
INTRAMUSCULAR | Status: AC
  Filled 2019-09-04: qty 50

## 2019-09-04 MED ORDER — ONDANSETRON HCL 4 MG/2ML IJ SOLN
INTRAMUSCULAR | Status: DC | PRN
  Administered 2019-09-04: 4 mg via INTRAVENOUS

## 2019-09-04 MED ORDER — ROPIVACAINE HCL 7.5 MG/ML IJ SOLN
INTRAMUSCULAR | Status: DC | PRN
  Administered 2019-09-04: 20 mL via PERINEURAL

## 2019-09-04 MED ORDER — ONDANSETRON HCL 4 MG/2ML IJ SOLN
INTRAMUSCULAR | Status: AC
  Filled 2019-09-04: qty 2

## 2019-09-04 MED ORDER — LACTATED RINGERS IV SOLN: INTRAVENOUS | Status: DC

## 2019-09-04 MED ORDER — MEPERIDINE HCL 50 MG/ML IJ SOLN: 6.2500 mg | INTRAMUSCULAR | Status: DC | PRN

## 2019-09-04 MED ORDER — SODIUM CHLORIDE 0.9 % IR SOLN
Status: DC | PRN
  Administered 2019-09-04: 1000 mL

## 2019-09-04 MED ORDER — ACETAMINOPHEN 500 MG PO TABS
1000.0000 mg | ORAL_TABLET | Freq: Four times a day (QID) | ORAL | Status: AC
  Administered 2019-09-04 – 2019-09-05 (×4): 1000 mg via ORAL
  Filled 2019-09-04 (×4): qty 2

## 2019-09-04 MED ORDER — ACETAMINOPHEN 325 MG PO TABS: 325.0000 mg | ORAL_TABLET | Freq: Once | ORAL | Status: DC | PRN

## 2019-09-04 MED ORDER — 0.9 % SODIUM CHLORIDE (POUR BTL) OPTIME
TOPICAL | Status: DC | PRN
  Administered 2019-09-04: 1000 mL

## 2019-09-04 MED ORDER — OXYCODONE HCL 5 MG PO TABS
10.0000 mg | ORAL_TABLET | ORAL | Status: DC | PRN
  Administered 2019-09-04: 10 mg via ORAL
  Filled 2019-09-04: qty 2

## 2019-09-04 MED ORDER — DEXAMETHASONE SODIUM PHOSPHATE 10 MG/ML IJ SOLN
INTRAMUSCULAR | Status: AC
  Filled 2019-09-04: qty 1

## 2019-09-04 MED ORDER — METHOCARBAMOL 500 MG PO TABS: 500.0000 mg | ORAL_TABLET | Freq: Four times a day (QID) | ORAL | Status: DC | PRN

## 2019-09-04 MED ORDER — PHENYLEPHRINE 40 MCG/ML (10ML) SYRINGE FOR IV PUSH (FOR BLOOD PRESSURE SUPPORT)
PREFILLED_SYRINGE | INTRAVENOUS | Status: DC | PRN
  Administered 2019-09-04 (×2): 80 ug via INTRAVENOUS

## 2019-09-04 MED ORDER — HYDROMORPHONE HCL 1 MG/ML IJ SOLN: 0.2500 mg | INTRAMUSCULAR | Status: DC | PRN

## 2019-09-04 MED ORDER — ORAL CARE MOUTH RINSE: 15.0000 mL | Freq: Once | OROMUCOSAL | Status: AC

## 2019-09-04 MED ORDER — DEXAMETHASONE SODIUM PHOSPHATE 10 MG/ML IJ SOLN
10.0000 mg | Freq: Once | INTRAMUSCULAR | Status: AC
  Administered 2019-09-05: 10 mg via INTRAVENOUS
  Filled 2019-09-04: qty 1

## 2019-09-04 MED ORDER — BUPIVACAINE HCL (PF) 0.75 % IJ SOLN
INTRAMUSCULAR | Status: DC | PRN
  Administered 2019-09-04: 1.6 mL via INTRATHECAL

## 2019-09-04 MED ORDER — DIPHENHYDRAMINE HCL 12.5 MG/5ML PO ELIX: 12.5000 mg | ORAL_SOLUTION | ORAL | Status: DC | PRN

## 2019-09-04 MED ORDER — ACETAMINOPHEN 160 MG/5ML PO SOLN: 325.0000 mg | Freq: Once | ORAL | Status: DC | PRN

## 2019-09-04 MED ORDER — LEVOTHYROXINE SODIUM 75 MCG PO TABS
75.0000 ug | ORAL_TABLET | Freq: Every day | ORAL | Status: DC
  Administered 2019-09-05: 75 ug via ORAL
  Filled 2019-09-04: qty 1

## 2019-09-04 MED ORDER — POLYETHYLENE GLYCOL 3350 17 G PO PACK: 17.0000 g | PACK | Freq: Every day | ORAL | Status: DC | PRN

## 2019-09-04 MED ORDER — PAROXETINE HCL 10 MG PO TABS
10.0000 mg | ORAL_TABLET | Freq: Every day | ORAL | Status: DC
  Administered 2019-09-05: 10 mg via ORAL
  Filled 2019-09-04: qty 1

## 2019-09-04 MED ORDER — MORPHINE SULFATE (PF) 2 MG/ML IV SOLN: 0.5000 mg | INTRAVENOUS | Status: DC | PRN

## 2019-09-04 MED ORDER — ACETAMINOPHEN 10 MG/ML IV SOLN: 1000.0000 mg | Freq: Once | INTRAVENOUS | Status: DC | PRN

## 2019-09-04 MED ORDER — DOCUSATE SODIUM 100 MG PO CAPS
100.0000 mg | ORAL_CAPSULE | Freq: Two times a day (BID) | ORAL | Status: DC
  Administered 2019-09-05 (×2): 100 mg via ORAL
  Filled 2019-09-04 (×2): qty 1

## 2019-09-04 MED ORDER — SODIUM CHLORIDE (PF) 0.9 % IJ SOLN
INTRAMUSCULAR | Status: AC
  Filled 2019-09-04: qty 10

## 2019-09-04 MED ORDER — CHLORHEXIDINE GLUCONATE 0.12 % MT SOLN
15.0000 mL | Freq: Once | OROMUCOSAL | Status: AC
  Administered 2019-09-04: 15 mL via OROMUCOSAL

## 2019-09-04 MED ORDER — ONDANSETRON HCL 4 MG/2ML IJ SOLN
4.0000 mg | Freq: Four times a day (QID) | INTRAMUSCULAR | Status: DC | PRN
  Administered 2019-09-04: 4 mg via INTRAVENOUS
  Filled 2019-09-04: qty 2

## 2019-09-04 MED ORDER — STERILE WATER FOR IRRIGATION IR SOLN
Status: DC | PRN
  Administered 2019-09-04: 2000 mL

## 2019-09-04 MED ORDER — METOCLOPRAMIDE HCL 5 MG/ML IJ SOLN: 5.0000 mg | Freq: Three times a day (TID) | INTRAMUSCULAR | Status: DC | PRN

## 2019-09-04 MED ORDER — METOCLOPRAMIDE HCL 5 MG PO TABS: 5.0000 mg | ORAL_TABLET | Freq: Three times a day (TID) | ORAL | Status: DC | PRN

## 2019-09-04 MED ORDER — DEXAMETHASONE SODIUM PHOSPHATE 10 MG/ML IJ SOLN
8.0000 mg | Freq: Once | INTRAMUSCULAR | Status: AC
  Administered 2019-09-04: 10 mg via INTRAVENOUS

## 2019-09-04 MED ORDER — BISACODYL 10 MG RE SUPP: 10.0000 mg | Freq: Every day | RECTAL | Status: DC | PRN

## 2019-09-04 MED ORDER — OXYCODONE HCL 5 MG PO TABS
5.0000 mg | ORAL_TABLET | ORAL | Status: DC | PRN
  Administered 2019-09-04 – 2019-09-05 (×3): 5 mg via ORAL
  Filled 2019-09-04 (×3): qty 1

## 2019-09-04 MED ORDER — TRANEXAMIC ACID-NACL 1000-0.7 MG/100ML-% IV SOLN
1000.0000 mg | INTRAVENOUS | Status: AC
  Administered 2019-09-04: 1000 mg via INTRAVENOUS
  Filled 2019-09-04: qty 100

## 2019-09-04 MED ORDER — MIDAZOLAM HCL 2 MG/2ML IJ SOLN
1.0000 mg | INTRAMUSCULAR | Status: DC
  Administered 2019-09-04: 1 mg via INTRAVENOUS
  Filled 2019-09-04: qty 2

## 2019-09-04 MED ORDER — BUPIVACAINE LIPOSOME 1.3 % IJ SUSP
INTRAMUSCULAR | Status: DC | PRN
  Administered 2019-09-04: 20 mL

## 2019-09-04 MED ORDER — SODIUM CHLORIDE 0.9 % IV SOLN: INTRAVENOUS | Status: DC

## 2019-09-04 MED ORDER — PROPOFOL 10 MG/ML IV BOLUS
INTRAVENOUS | Status: DC | PRN
  Administered 2019-09-04 (×2): 20 mg via INTRAVENOUS

## 2019-09-04 MED ORDER — PROMETHAZINE HCL 25 MG/ML IJ SOLN: 6.2500 mg | INTRAMUSCULAR | Status: DC | PRN

## 2019-09-04 MED ORDER — POVIDONE-IODINE 10 % EX SWAB
2.0000 "application " | Freq: Once | CUTANEOUS | Status: AC
  Administered 2019-09-04: 2 via TOPICAL

## 2019-09-04 MED ORDER — PHENYLEPHRINE 40 MCG/ML (10ML) SYRINGE FOR IV PUSH (FOR BLOOD PRESSURE SUPPORT)
PREFILLED_SYRINGE | INTRAVENOUS | Status: AC
  Filled 2019-09-04: qty 10

## 2019-09-04 MED ORDER — ASPIRIN EC 325 MG PO TBEC
325.0000 mg | DELAYED_RELEASE_TABLET | Freq: Two times a day (BID) | ORAL | Status: DC
  Administered 2019-09-05: 325 mg via ORAL
  Filled 2019-09-04: qty 1

## 2019-09-04 MED ORDER — PROPOFOL 1000 MG/100ML IV EMUL
INTRAVENOUS | Status: AC
  Filled 2019-09-04: qty 100

## 2019-09-04 MED ORDER — MENTHOL 3 MG MT LOZG: 1.0000 | LOZENGE | OROMUCOSAL | Status: DC | PRN

## 2019-09-04 MED ORDER — METHOCARBAMOL 500 MG IVPB - SIMPLE MED
500.0000 mg | Freq: Four times a day (QID) | INTRAVENOUS | Status: DC | PRN
  Filled 2019-09-04: qty 50

## 2019-09-04 MED ORDER — SODIUM CHLORIDE (PF) 0.9 % IJ SOLN
INTRAMUSCULAR | Status: DC | PRN
  Administered 2019-09-04: 60 mL

## 2019-09-04 MED ORDER — PROPOFOL 500 MG/50ML IV EMUL
INTRAVENOUS | Status: DC | PRN
  Administered 2019-09-04: 60 ug/kg/min via INTRAVENOUS

## 2019-09-04 MED ORDER — GABAPENTIN 300 MG PO CAPS
300.0000 mg | ORAL_CAPSULE | Freq: Three times a day (TID) | ORAL | Status: DC
  Administered 2019-09-04 – 2019-09-05 (×3): 300 mg via ORAL
  Filled 2019-09-04 (×3): qty 1

## 2019-09-04 MED ORDER — FENTANYL CITRATE (PF) 100 MCG/2ML IJ SOLN
50.0000 ug | INTRAMUSCULAR | Status: DC
  Administered 2019-09-04: 50 ug via INTRAVENOUS
  Filled 2019-09-04: qty 2

## 2019-09-04 MED ORDER — CEFAZOLIN SODIUM-DEXTROSE 2-4 GM/100ML-% IV SOLN
2.0000 g | Freq: Four times a day (QID) | INTRAVENOUS | Status: AC
  Administered 2019-09-04 (×2): 2 g via INTRAVENOUS
  Filled 2019-09-04 (×2): qty 100

## 2019-09-04 SURGICAL SUPPLY — 53 items
ATTUNE PS FEM RT SZ 5 CEM KNEE (Femur) ×2 IMPLANT
ATTUNE PSRP INSR SZ5 8 KNEE (Insert) ×1 IMPLANT
ATTUNE PSRP INSR SZ5 8MM KNEE (Insert) ×1 IMPLANT
BAG ZIPLOCK 12X15 (MISCELLANEOUS) ×3 IMPLANT
BASE TIBIAL ROT PLAT SZ 5 KNEE (Knees) IMPLANT
BLADE SAG 18X100X1.27 (BLADE) ×3 IMPLANT
BLADE SAW SGTL 11.0X1.19X90.0M (BLADE) ×3 IMPLANT
BLADE SURG SZ10 CARB STEEL (BLADE) ×6 IMPLANT
BNDG ELASTIC 6X5.8 VLCR STR LF (GAUZE/BANDAGES/DRESSINGS) ×3 IMPLANT
BOWL SMART MIX CTS (DISPOSABLE) ×3 IMPLANT
CEMENT HV SMART SET (Cement) ×6 IMPLANT
CLOSURE WOUND 1/2 X4 (GAUZE/BANDAGES/DRESSINGS) ×2
COVER SURGICAL LIGHT HANDLE (MISCELLANEOUS) ×3 IMPLANT
COVER WAND RF STERILE (DRAPES) IMPLANT
CUFF TOURN SGL QUICK 34 (TOURNIQUET CUFF) ×2
CUFF TRNQT CYL 34X4.125X (TOURNIQUET CUFF) ×1 IMPLANT
DECANTER SPIKE VIAL GLASS SM (MISCELLANEOUS) ×3 IMPLANT
DRAPE U-SHAPE 47X51 STRL (DRAPES) ×3 IMPLANT
DRSG AQUACEL AG ADV 3.5X10 (GAUZE/BANDAGES/DRESSINGS) ×3 IMPLANT
DURAPREP 26ML APPLICATOR (WOUND CARE) ×3 IMPLANT
ELECT REM PT RETURN 15FT ADLT (MISCELLANEOUS) ×3 IMPLANT
GLOVE BIO SURGEON STRL SZ7 (GLOVE) ×3 IMPLANT
GLOVE BIO SURGEON STRL SZ8 (GLOVE) ×3 IMPLANT
GLOVE BIOGEL PI IND STRL 7.0 (GLOVE) ×1 IMPLANT
GLOVE BIOGEL PI IND STRL 8 (GLOVE) ×1 IMPLANT
GLOVE BIOGEL PI INDICATOR 7.0 (GLOVE) ×2
GLOVE BIOGEL PI INDICATOR 8 (GLOVE) ×2
GOWN STRL REUS W/TWL LRG LVL3 (GOWN DISPOSABLE) ×6 IMPLANT
HANDPIECE INTERPULSE COAX TIP (DISPOSABLE) ×2
HOLDER FOLEY CATH W/STRAP (MISCELLANEOUS) IMPLANT
IMMOBILIZER KNEE 20 (SOFTGOODS) ×3 IMPLANT
IMMOBILIZER KNEE 20 THIGH 36 (SOFTGOODS) ×1 IMPLANT
KIT TURNOVER KIT A (KITS) IMPLANT
MANIFOLD NEPTUNE II (INSTRUMENTS) ×3 IMPLANT
NS IRRIG 1000ML POUR BTL (IV SOLUTION) ×3 IMPLANT
PACK TOTAL KNEE CUSTOM (KITS) ×3 IMPLANT
PADDING CAST COTTON 6X4 STRL (CAST SUPPLIES) ×3 IMPLANT
PATELLA MEDIAL ATTUN 35MM KNEE (Knees) ×2 IMPLANT
PENCIL SMOKE EVACUATOR (MISCELLANEOUS) ×3 IMPLANT
PIN DRILL FIX HALF THREAD (BIT) ×2 IMPLANT
PIN STEINMAN FIXATION KNEE (PIN) ×2 IMPLANT
PROTECTOR NERVE ULNAR (MISCELLANEOUS) ×3 IMPLANT
SET HNDPC FAN SPRY TIP SCT (DISPOSABLE) ×1 IMPLANT
STRIP CLOSURE SKIN 1/2X4 (GAUZE/BANDAGES/DRESSINGS) ×4 IMPLANT
SUT MNCRL AB 4-0 PS2 18 (SUTURE) ×3 IMPLANT
SUT STRATAFIX 0 PDS 27 VIOLET (SUTURE) ×3
SUT VIC AB 2-0 CT1 27 (SUTURE) ×6
SUT VIC AB 2-0 CT1 TAPERPNT 27 (SUTURE) ×3 IMPLANT
SUTURE STRATFX 0 PDS 27 VIOLET (SUTURE) ×1 IMPLANT
TIBIAL BASE ROT PLAT SZ 5 KNEE (Knees) ×3 IMPLANT
TRAY FOLEY MTR SLVR 16FR STAT (SET/KITS/TRAYS/PACK) ×3 IMPLANT
WATER STERILE IRR 1000ML POUR (IV SOLUTION) ×6 IMPLANT
WRAP KNEE MAXI GEL POST OP (GAUZE/BANDAGES/DRESSINGS) ×3 IMPLANT

## 2019-09-04 NOTE — Evaluation (Signed)
Physical Therapy Evaluation Patient Details Name: Sheryl Hamilton MRN: [id number] DOB: 30-Apr-1950 Today's Date: 09/04/2019   History of Present Illness  Patient is 69 y.o. female s/p Rt TKA on 09/04/19 with PMH significant for HTN, hypothyroidism, OA, Lt TKA in 2016.  Clinical Impression  Sheryl Hamilton is a 69 y.o. female POD 0 s/p Rt TKA. Patient reports independence with mobility at baseline. Patient is now limited by functional impairments (see PT problem list below) and requires min assist for transfers and gait with RW. Patient was able to ambulate ~30 feet with RW and min assist. Patient instructed in exercise to facilitate ROM and circulation. Patient will benefit from continued skilled PT interventions to address impairments and progress towards PLOF. Acute PT will follow to progress mobility and stair training in preparation for safe discharge home.     Follow Up Recommendations Follow surgeon's recommendation for DC plan and follow-up therapies;Outpatient PT    Equipment Recommendations  Rolling walker with 5" wheels    Recommendations for Other Services       Precautions / Restrictions Precautions Precautions: Fall Restrictions Weight Bearing Restrictions: No      Mobility  Bed Mobility Overal bed mobility: Needs Assistance Bed Mobility: Supine to Sit     Supine to sit: HOB elevated;Min guard     General bed mobility comments: cues to use bed rail and guarding for safety. pt able to bring LE's off EOB without assist.  Transfers Overall transfer level: Needs assistance Equipment used: Rolling walker (2 wheeled) Transfers: Sit to/from Stand Sit to Stand: Min assist         General transfer comment: Light min assist for power up and rise with RW, cues for safe technique.  Ambulation/Gait Ambulation/Gait assistance: Min assist;Min guard Gait Distance (Feet): 80 Feet Assistive device: Rolling walker (2 wheeled) Gait Pattern/deviations: Step-to pattern;Decreased  stride length;Decreased stance time - right;Decreased weight shift to right Gait velocity: decr   General Gait Details: cues for safe proximity to RW, and for safe step pattern, no overt LOB noted, pt limited by nausea and pain.  Stairs            Wheelchair Mobility    Modified Rankin (Stroke Patients Only)       Balance Overall balance assessment: Needs assistance Sitting-balance support: Feet supported Sitting balance-Leahy Scale: Good     Standing balance support: During functional activity;Bilateral upper extremity supported Standing balance-Leahy Scale: Fair            Pertinent Vitals/Pain Pain Assessment: 0-10 Pain Score: 5  Pain Location: Rt knee Pain Descriptors / Indicators: Aching;Discomfort Pain Intervention(s): Limited activity within patient's tolerance;Monitored during session;Repositioned;Ice applied;Patient requesting pain meds-RN notified    Home Living Family/patient expects to be discharged to:: Private residence Living Arrangements: Children Available Help at Discharge: Family Type of Home: House Home Access: Stairs to enter Entrance Stairs-Rails: Left Entrance Stairs-Number of Steps: 3 Home Layout: One level Home Equipment: Walker - 2 wheels Additional Comments: pt will stay with her daughter as she recovers and does OPPT, she will [place] home once fully recovered.    Prior Function Level of Independence: Independent           Hand Dominance   Dominant Hand: Right    Extremity/Trunk Assessment   Upper Extremity Assessment Upper Extremity Assessment: Overall WFL for tasks assessed    Lower Extremity Assessment Lower Extremity Assessment: RLE deficits/detail RLE Deficits / Details: good quad activation, no extensor lag with SLR.  RLE Sensation: WNL RLE Coordination: WNL    Cervical / Trunk Assessment Cervical / Trunk Assessment: Normal  Communication   Communication: No difficulties  Cognition  Arousal/Alertness: Awake/alert Behavior During Therapy: WFL for tasks assessed/performed Overall Cognitive Status: Within Functional Limits for tasks assessed               General Comments      Exercises Total Joint Exercises Ankle Circles/Pumps: AROM;Both;10 reps;Seated   Assessment/Plan    PT Assessment Patient needs continued PT services  PT Problem List Decreased strength;Decreased range of motion;Decreased activity tolerance;Decreased mobility;Decreased balance;Pain       PT Treatment Interventions DME instruction;Gait training;Stair training;Functional mobility training;Therapeutic activities;Therapeutic exercise;Balance training;Patient/family education    PT Goals (Current goals can be found in the Care Plan section)  Acute Rehab PT Goals Patient Stated Goal: to recover and get back to the beach. PT Goal Formulation: With patient Time For Goal Achievement: 09/11/19 Potential to Achieve Goals: Good    Frequency 7X/week    AM-PAC PT "6 Clicks" Mobility  Outcome Measure Help needed turning from your back to your side while in a flat bed without using bedrails?: A Little Help needed moving from lying on your back to sitting on the side of a flat bed without using bedrails?: A Little Help needed moving to and from a bed to a chair (including a wheelchair)?: A Little Help needed standing up from a chair using your arms (e.g., wheelchair or bedside chair)?: A Little Help needed to walk in hospital room?: A Little Help needed climbing 3-5 steps with a railing? : A Little 6 Click Score: 18    End of Session Equipment Utilized During Treatment: Gait belt Activity Tolerance: Patient tolerated treatment well Patient left: in chair;with call bell/phone within reach;with chair alarm set;with family/visitor present Nurse Communication: Mobility status PT Visit Diagnosis: Muscle weakness (generalized) (M62.81);Difficulty in walking, not elsewhere classified (R26.2)     Time: 2620-3559 PT Time Calculation (min) (ACUTE ONLY): 28 min   Charges:   PT Evaluation $PT Eval Low Complexity: 1 Low PT Treatments $Gait Training: 8-22 mins       Verner Mould, DPT Acute Rehabilitation Services  Office [phone] Pager [phone]  09/04/2019 2:09 PM

## 2019-09-04 NOTE — Plan of Care (Signed)
Plan of care 

## 2019-09-04 NOTE — Interval H&P Note (Signed)
History and Physical Interval Note:  09/04/2019 6:28 AM  Sheryl Hamilton  has presented today for surgery, with the diagnosis of right knee osteoarthritis.  The various methods of treatment have been discussed with the patient and family. After consideration of risks, benefits and other options for treatment, the patient has consented to  Procedure(s): TOTAL KNEE ARTHROPLASTY (Right) as a surgical intervention.  The patient's history has been reviewed, patient examined, no change in status, stable for surgery.  I have reviewed the patient's chart and labs.  Questions were answered to the patient's satisfaction.     Pilar Plate Teige Rountree

## 2019-09-04 NOTE — Transfer of Care (Signed)
Immediate Anesthesia Transfer of Care Note  Patient: Sheryl Hamilton  Procedure(s) Performed: TOTAL KNEE ARTHROPLASTY (Right Knee)  Patient Location: PACU  Anesthesia Type:Spinal  Level of Consciousness: awake, alert , oriented and patient cooperative  Airway & Oxygen Therapy: Patient Spontanous Breathing  Post-op Assessment: Report given to RN and Post -op Vital signs reviewed and stable  Post vital signs: Reviewed and stable  Last Vitals:  Vitals Value Taken Time  BP 123/76 09/04/19 0945  Temp    Pulse 75 09/04/19 0946  Resp 11 09/04/19 0946  SpO2 97 % 09/04/19 0946  Vitals shown include unvalidated device data.  Last Pain:  Vitals:   09/04/19 0810  TempSrc:   PainSc: 0-No pain      Patients Stated Pain Goal: 4 (89/78/47 8412)  Complications: No complications documented.

## 2019-09-04 NOTE — Progress Notes (Signed)
Assisted Dr. Hollis with right, ultrasound guided, adductor canal block. Side rails up, monitors on throughout procedure. See vital signs in flow sheet. Tolerated Procedure well.  

## 2019-09-04 NOTE — Anesthesia Procedure Notes (Signed)
Anesthesia Regional Block: Adductor canal block   Pre-Anesthetic Checklist: ,, timeout performed, Correct Patient, Correct Site, Correct Laterality, Correct Procedure, Correct Position, site marked, Risks and benefits discussed,  Surgical consent,  Pre-op evaluation,  At surgeon's request and post-op pain management  Laterality: Right  Prep: chloraprep       Needles:  Injection technique: Single-shot  Needle Type: Echogenic Stimulator Needle     Needle Length: 9cm  Needle Gauge: 21     Additional Needles:   Procedures:,,,, ultrasound used (permanent image in chart),,,,  Narrative:  Start time: 09/04/2019 7:50 AM End time: 09/04/2019 7:55 AM Injection made incrementally with aspirations every 5 mL.  Performed by: Personally  Anesthesiologist: Effie Berkshire, MD  Additional Notes: Patient tolerated the procedure well. Local anesthetic introduced in an incremental fashion under minimal resistance after negative aspirations. No paresthesias were elicited. After completion of the procedure, no acute issues were identified and patient continued to be monitored by RN.

## 2019-09-04 NOTE — Anesthesia Procedure Notes (Signed)
Spinal  Patient location during procedure: OR Start time: 09/04/2019 8:20 AM End time: 09/04/2019 8:22 AM Staffing Performed: resident/CRNA  Resident/CRNA: Claudia Desanctis, CRNA Preanesthetic Checklist Completed: patient identified, IV checked, site marked, risks and benefits discussed, surgical consent, monitors and equipment checked, pre-op evaluation and timeout performed Spinal Block Patient position: sitting Prep: DuraPrep Patient monitoring: heart rate, cardiac monitor, continuous pulse ox and blood pressure Approach: midline Location: L3-4 Injection technique: single-shot Needle Needle type: Pencan  Needle gauge: 24 G Needle length: 9 cm Needle insertion depth: 6 cm Assessment Sensory level: T4

## 2019-09-04 NOTE — Anesthesia Preprocedure Evaluation (Addendum)
Anesthesia Evaluation  Patient identified by MRN, date of birth, ID band Patient awake    Reviewed: Allergy & Precautions, NPO status , Patient's Chart, lab work & pertinent test results  History of Anesthesia Complications (+) PONV and history of anesthetic complications  Airway Mallampati: I  TM Distance: >3 FB Neck ROM: Full    Dental  (+) Teeth Intact, Dental Advisory Given   Pulmonary neg pulmonary ROS,    breath sounds clear to auscultation       Cardiovascular hypertension, Pt. on medications  Rhythm:Regular Rate:Normal     Neuro/Psych negative neurological ROS  negative psych ROS   GI/Hepatic negative GI ROS, Neg liver ROS,   Endo/Other  Hypothyroidism   Renal/GU negative Renal ROS     Musculoskeletal  (+) Arthritis ,   Abdominal Normal abdominal exam  (+)   Peds  Hematology negative hematology ROS (+)   Anesthesia Other Findings   Reproductive/Obstetrics                            Anesthesia Physical Anesthesia Plan  ASA: II  Anesthesia Plan: Spinal   Post-op Pain Management:  Regional for Post-op pain   Induction: Intravenous  PONV Risk Score and Plan: 4 or greater and Ondansetron, Dexamethasone, Propofol infusion and Treatment may vary due to age or medical condition  Airway Management Planned: Natural Airway and Simple Face Mask  Additional Equipment: None  Intra-op Plan:   Post-operative Plan:   Informed Consent: I have reviewed the patients History and Physical, chart, labs and discussed the procedure including the risks, benefits and alternatives for the proposed anesthesia with the patient or authorized representative who has indicated his/her understanding and acceptance.     Dental advisory given  Plan Discussed with: CRNA  Anesthesia Plan Comments: (Lab Results      Component                Value               Date                      WBC                       6.7                 08/23/2019                HGB                      14.4                08/23/2019                HCT                      44.1                08/23/2019                MCV                      93.4                08/23/2019                PLT  262                 08/23/2019            Lab Results      Component                Value               Date                      INR                      1.0                 08/23/2019                INR                      1.00                06/04/2014           )       Anesthesia Quick Evaluation

## 2019-09-04 NOTE — Op Note (Signed)
OPERATIVE REPORT-TOTAL KNEE ARTHROPLASTY   Pre-operative diagnosis- Osteoarthritis  Right knee(s)  Post-operative diagnosis- Osteoarthritis Right knee(s)  Procedure-  Right  Total Knee Arthroplasty  Surgeon- Dione Plover. Amarie Viles, MD  Assistant- Molli Barrows, PA-C   Anesthesia-  Adductor canal block and spinal  EBL-25 mL   Drains Hemovac  Tourniquet time- 37 minutes @300  mm Hg  Complications- None  Condition-PACU - hemodynamically stable.   Brief Clinical Note  Sheryl Hamilton is a 69 y.o. year old female with end stage OA of her right knee with progressively worsening pain and dysfunction. She has constant pain, with activity and at rest and significant functional deficits with difficulties even with ADLs. She has had extensive non-op management including analgesics, injections of cortisone and viscosupplements, and home exercise program, but remains in significant pain with significant dysfunction.Radiographs show bone on bone arthritis medial and patellofemoral. She presents now for right Total Knee Arthroplasty.    Procedure in detail---   The patient is brought into the operating room and positioned supine on the operating table. After successful administration of  Adductor canal block and spinal,   a tourniquet is placed high on the  Right thigh(s) and the lower extremity is prepped and draped in the usual sterile fashion. Time out is performed by the operating team and then the  Right lower extremity is wrapped in Esmarch, knee flexed and the tourniquet inflated to 300 mmHg.       A midline incision is made with a ten blade through the subcutaneous tissue to the level of the extensor mechanism. A fresh blade is used to make a medial parapatellar arthrotomy. Soft tissue over the proximal medial tibia is subperiosteally elevated to the joint line with a knife and into the semimembranosus bursa with a Cobb elevator. Soft tissue over the proximal lateral tibia is elevated with attention  being paid to avoiding the patellar tendon on the tibial tubercle. The patella is everted, knee flexed 90 degrees and the ACL and PCL are removed. Findings are bone on bone medial and patellofemoral with large global osteophytes        The drill is used to create a starting hole in the distal femur and the canal is thoroughly irrigated with sterile saline to remove the fatty contents. The 5 degree Right  valgus alignment guide is placed into the femoral canal and the distal femoral cutting block is pinned to remove 9 mm off the distal femur. Resection is made with an oscillating saw.      The tibia is subluxed forward and the menisci are removed. The extramedullary alignment guide is placed referencing proximally at the medial aspect of the tibial tubercle and distally along the second metatarsal axis and tibial crest. The block is pinned to remove 48mm off the more deficient medial  side. Resection is made with an oscillating saw. Size 5is the most appropriate size for the tibia and the proximal tibia is prepared with the modular drill and keel punch for that size.      The femoral sizing guide is placed and size 5 is most appropriate. Rotation is marked off the epicondylar axis and confirmed by creating a rectangular flexion gap at 90 degrees. The size 5 cutting block is pinned in this rotation and the anterior, posterior and chamfer cuts are made with the oscillating saw. The intercondylar block is then placed and that cut is made.      Trial size 5 tibial component, trial size 5 posterior stabilized femur  and a 8  mm posterior stabilized rotating platform insert trial is placed. Full extension is achieved with excellent varus/valgus and anterior/posterior balance throughout full range of motion. The patella is everted and thickness measured to be 22  mm. Free hand resection is taken to 12 mm, a 35 template is placed, lug holes are drilled, trial patella is placed, and it tracks normally. Osteophytes are  removed off the posterior femur with the trial in place. All trials are removed and the cut bone surfaces prepared with pulsatile lavage. Cement is mixed and once ready for implantation, the size 5 tibial implant, size  5 posterior stabilized femoral component, and the size 35 patella are cemented in place and the patella is held with the clamp. The trial insert is placed and the knee held in full extension. The Exparel (20 ml mixed with 60 ml saline) is injected into the extensor mechanism, posterior capsule, medial and lateral gutters and subcutaneous tissues.  All extruded cement is removed and once the cement is hard the permanent 8 mm posterior stabilized rotating platform insert is placed into the tibial tray.      The wound is copiously irrigated with saline solution and the extensor mechanism closed over a hemovac drain with #1 V-loc suture. The tourniquet is released for a total tourniquet time of 37  minutes. Flexion against gravity is 140 degrees and the patella tracks normally. Subcutaneous tissue is closed with 2.0 vicryl and subcuticular with running 4.0 Monocryl. The incision is cleaned and dried and steri-strips and a bulky sterile dressing are applied. The limb is placed into a knee immobilizer and the patient is awakened and transported to recovery in stable condition.      Please note that a surgical assistant was a medical necessity for this procedure in order to perform it in a safe and expeditious manner. Surgical assistant was necessary to retract the ligaments and vital neurovascular structures to prevent injury to them and also necessary for proper positioning of the limb to allow for anatomic placement of the prosthesis.   Dione Plover Isiac Breighner, MD    09/04/2019, 9:26 AM

## 2019-09-04 NOTE — Anesthesia Postprocedure Evaluation (Signed)
Anesthesia Post Note  Patient: Sheryl Hamilton  Procedure(s) Performed: TOTAL KNEE ARTHROPLASTY (Right Knee)     Patient location during evaluation: PACU Anesthesia Type: Spinal Level of consciousness: oriented and awake and alert Pain management: pain level controlled Vital Signs Assessment: post-procedure vital signs reviewed and stable Respiratory status: spontaneous breathing, respiratory function stable and patient connected to nasal cannula oxygen Cardiovascular status: blood pressure returned to baseline and stable Postop Assessment: no headache, no backache and no apparent nausea or vomiting Anesthetic complications: no   No complications documented.  Last Vitals:  Vitals:   09/04/19 1000 09/04/19 1015  BP: (!) 132/88 (!) 143/83  Pulse: 73 70  Resp: 18 12  Temp:    SpO2: 99% 99%    Last Pain:  Vitals:   09/04/19 1015  TempSrc:   PainSc: 0-No pain                 Effie Berkshire

## 2019-09-05 ENCOUNTER — Encounter (HOSPITAL_COMMUNITY): Payer: Self-pay | Admitting: Orthopedic Surgery

## 2019-09-05 LAB — CBC
HCT: 35.9 % — ABNORMAL LOW (ref 36.0–46.0)
Hemoglobin: 11.5 g/dL — ABNORMAL LOW (ref 12.0–15.0)
MCH: 30.3 pg (ref 26.0–34.0)
MCHC: 32 g/dL (ref 30.0–36.0)
MCV: 94.5 fL (ref 80.0–100.0)
Platelets: 232 10*3/uL (ref 150–400)
RBC: 3.8 MIL/uL — ABNORMAL LOW (ref 3.87–5.11)
RDW: 14.3 % (ref 11.5–15.5)
WBC: 14.6 10*3/uL — ABNORMAL HIGH (ref 4.0–10.5)
nRBC: 0 % (ref 0.0–0.2)

## 2019-09-05 LAB — BASIC METABOLIC PANEL
Anion gap: 9 (ref 5–15)
BUN: 9 mg/dL (ref 8–23)
CO2: 25 mmol/L (ref 22–32)
Calcium: 8.7 mg/dL — ABNORMAL LOW (ref 8.9–10.3)
Chloride: 106 mmol/L (ref 98–111)
Creatinine, Ser: 0.89 mg/dL (ref 0.44–1.00)
GFR calc Af Amer: >60 mL/min (ref >60)
GFR calc non Af Amer: >60 mL/min (ref >60)
Glucose, Bld: 185 mg/dL — ABNORMAL HIGH (ref 70–99)
Potassium: 3.9 mmol/L (ref 3.5–5.1)
Sodium: 140 mmol/L (ref 135–145)

## 2019-09-05 MED ORDER — ASPIRIN 325 MG PO TBEC: 325.0000 mg | DELAYED_RELEASE_TABLET | Freq: Two times a day (BID) | ORAL | 0 refills | Status: AC

## 2019-09-05 MED ORDER — SODIUM CHLORIDE 0.9 % IV BOLUS
250.0000 mL | Freq: Once | INTRAVENOUS | Status: AC
  Administered 2019-09-05: 250 mL via INTRAVENOUS

## 2019-09-05 MED ORDER — METHOCARBAMOL 500 MG PO TABS: 500.0000 mg | ORAL_TABLET | Freq: Four times a day (QID) | ORAL | 0 refills | Status: DC | PRN

## 2019-09-05 MED ORDER — OXYCODONE HCL 5 MG PO TABS: 5.0000 mg | ORAL_TABLET | Freq: Four times a day (QID) | ORAL | 0 refills | Status: DC | PRN

## 2019-09-05 MED ORDER — SODIUM CHLORIDE 0.9 % IV BOLUS: 250.0000 mL | Freq: Once | INTRAVENOUS | Status: DC

## 2019-09-05 MED ORDER — GABAPENTIN 300 MG PO CAPS: ORAL_CAPSULE | ORAL | 0 refills | Status: DC

## 2019-09-05 NOTE — Progress Notes (Signed)
Physical Therapy Treatment Patient Details Name: Sheryl Hamilton MRN: [id number] DOB: 12-Jul-1950 Today's Date: 09/05/2019    History of Present Illness Patient is 69 y.o. female s/p Rt TKA on 09/04/19 with PMH significant for HTN, hypothyroidism, OA, Lt TKA in 2016.    PT Comments    POD # 1 pm session Assisted with amb in hallway a second time Then returned to room to perform some TE's following HEP handout.  Instructed on proper tech, freq as well as use of ICE.   Addressed all mobility questions, discussed appropriate activity, educated on use of ICE.  Pt ready for D/C to home.   Follow Up Recommendations  Follow surgeon's recommendation for DC plan and follow-up therapies;Outpatient PT     Equipment Recommendations  Rolling walker with 5" wheels    Recommendations for Other Services       Precautions / Restrictions Precautions Precautions: Fall Precaution Comments: instructed no pillow under knee Restrictions Weight Bearing Restrictions: No Other Position/Activity Restrictions: WBAT    Mobility  Bed Mobility               General bed mobility comments: OOB in recliner  Transfers Overall transfer level: Needs assistance Equipment used: Rolling walker (2 wheeled) Transfers: Sit to/from Stand Sit to Stand: Supervision         General transfer comment: <25% VC's on safety with turns  Ambulation/Gait Ambulation/Gait assistance: Min guard Gait Distance (Feet): 75 Feet Assistive device: Rolling walker (2 wheeled) Gait Pattern/deviations: Step-to pattern;Decreased stride length;Decreased stance time - right;Decreased weight shift to right Gait velocity: decreased   General Gait Details: <25% VC's on safety with turns   Stairs Stairs: Yes Stairs assistance: Supervision;Min guard Stair Management: One rail Left;Forwards Number of Stairs: 2 General stair comments: 25% VC's on proper sequencing and safety   Wheelchair Mobility    Modified Rankin (Stroke  Patients Only)       Balance                                            Cognition Arousal/Alertness: Awake/alert Behavior During Therapy: WFL for tasks assessed/performed Overall Cognitive Status: Within Functional Limits for tasks assessed                                 General Comments: AxO x 4 pleasant      Exercises      General Comments        Pertinent Vitals/Pain Pain Assessment: 0-10 Pain Score: 5  Pain Location: Rt knee Pain Descriptors / Indicators: Aching;Discomfort Pain Intervention(s): Monitored during session;Premedicated before session;Repositioned;Ice applied    Home Living                      Prior Function            PT Goals (current goals can now be found in the care plan section) Progress towards PT goals: Progressing toward goals    Frequency    7X/week      PT Plan Current plan remains appropriate    Co-evaluation              AM-PAC PT "6 Clicks" Mobility   Outcome Measure  Help needed turning from your back to your side while in a flat bed without using bedrails?: None  Help needed moving from lying on your back to sitting on the side of a flat bed without using bedrails?: None Help needed moving to and from a bed to a chair (including a wheelchair)?: A Little Help needed standing up from a chair using your arms (e.g., wheelchair or bedside chair)?: A Little Help needed to walk in hospital room?: A Little Help needed climbing 3-5 steps with a railing? : A Little 6 Click Score: 20    End of Session Equipment Utilized During Treatment: Gait belt Activity Tolerance: Patient tolerated treatment well Patient left: in chair;with call bell/phone within reach;with chair alarm set;with family/visitor present Nurse Communication: Mobility status PT Visit Diagnosis: Muscle weakness (generalized) (M62.81);Difficulty in walking, not elsewhere classified (R26.2)     Time: 1410-1435 PT  Time Calculation (min) (ACUTE ONLY): 25 min  Charges:  $Gait Training: 8-22 mins $Therapeutic Exercise: 8-22 mins                     Rica Koyanagi  PTA Acute  Rehabilitation Services Pager      [phone] Office      [phone]

## 2019-09-05 NOTE — TOC Transition Note (Signed)
Transition of Care [facility]) - CM/SW Discharge Note   Patient Details  Name: Sheryl Hamilton MRN: [id number] Date of Birth: 09-08-1950  Transition of Care [facility]) CM/SW Contact:  Lia Hopping, [place] Phone Number: 09/05/2019, 10:00 AM   Clinical Narrative:    Therapy Plan: OPPT, start Thursday RW ordered through [place] and delivered to the pt. Bedside.  Pt. Has high toilet seats, shower seat and a cane.  No other needs identified.     Final next level of care: OP Rehab Barriers to Discharge: No Barriers Identified   Patient Goals and CMS Choice     Choice offered to / list presented to : NA  Discharge Placement                       Discharge Plan and Services                DME Arranged: Walker rolling DME Agency: [employer] Date DME Agency Contacted: 09/05/19 Time DME Agency Contacted: 0900 Representative spoke with at DME Agency: Ovid Curd HH Arranged: NA [place] Agency: NA        Social Determinants of Health ([place]) Interventions     Readmission Risk Interventions No flowsheet data found.

## 2019-09-05 NOTE — Progress Notes (Signed)
Physical Therapy Treatment Patient Details Name: Sheryl Hamilton MRN: [id number] DOB: [birth year] Today's Date: 09/05/2019    History of Present Illness Patient is 69 y.o. female s/p Rt TKA on 09/04/19 with PMH significant for HTN, hypothyroidism, OA, Lt TKA in 2016.    PT Comments    POD # 1 am session Assisted with amb in hallway, practiced stairs Then returned to room to perform some TE's following HEP handout.  Instructed on proper tech, freq as well as use of ICE.   Pt will need another PT session to complete HEP  Follow Up Recommendations  Follow surgeon's recommendation for DC plan and follow-up therapies;Outpatient PT     Equipment Recommendations  Rolling walker with 5" wheels    Recommendations for Other Services       Precautions / Restrictions Precautions Precautions: Fall Precaution Comments: instructed no pillow under knee Restrictions Weight Bearing Restrictions: No Other Position/Activity Restrictions: WBAT    Mobility  Bed Mobility               General bed mobility comments: OOB in recliner  Transfers Overall transfer level: Needs assistance Equipment used: Rolling walker (2 wheeled) Transfers: Sit to/from Stand Sit to Stand: Supervision         General transfer comment: <25% VC's on safety with turns  Ambulation/Gait Ambulation/Gait assistance: Min guard Gait Distance (Feet): 75 Feet Assistive device: Rolling walker (2 wheeled) Gait Pattern/deviations: Step-to pattern;Decreased stride length;Decreased stance time - right;Decreased weight shift to right Gait velocity: decreased   General Gait Details: <25% VC's on safety with turns   Stairs Stairs: Yes Stairs assistance: Supervision;Min guard Stair Management: One rail Left;Forwards Number of Stairs: 2 General stair comments: 25% VC's on proper sequencing and safety   Wheelchair Mobility    Modified Rankin (Stroke Patients Only)       Balance                                             Cognition Arousal/Alertness: Awake/alert Behavior During Therapy: WFL for tasks assessed/performed Overall Cognitive Status: Within Functional Limits for tasks assessed                                 General Comments: AxO x 4 pleasant      Exercises   Total Knee Replacement TE's following HEP handout 10 reps B LE ankle pumps 05 reps towel squeezes 05 reps knee presses 05 reps heel slides  05 reps SAQ's 05 reps SLR's 05 reps ABD Educated on use of gait belt to assist with TE's Followed by ICE     General Comments        Pertinent Vitals/Pain Pain Assessment: 0-10 Pain Score: 5  Pain Location: Rt knee Pain Descriptors / Indicators: Aching;Discomfort Pain Intervention(s): Monitored during session;Premedicated before session;Repositioned;Ice applied    Home Living                      Prior Function            PT Goals (current goals can now be found in the care plan section) Progress towards PT goals: Progressing toward goals    Frequency    7X/week      PT Plan Current plan remains appropriate    Co-evaluation  AM-PAC PT "6 Clicks" Mobility   Outcome Measure  Help needed turning from your back to your side while in a flat bed without using bedrails?: None Help needed moving from lying on your back to sitting on the side of a flat bed without using bedrails?: None Help needed moving to and from a bed to a chair (including a wheelchair)?: A Little Help needed standing up from a chair using your arms (e.g., wheelchair or bedside chair)?: A Little Help needed to walk in hospital room?: A Little Help needed climbing 3-5 steps with a railing? : A Little 6 Click Score: 20    End of Session Equipment Utilized During Treatment: Gait belt Activity Tolerance: Patient tolerated treatment well Patient left: in chair;with call bell/phone within reach;with chair alarm set;with family/visitor  present Nurse Communication: Mobility status PT Visit Diagnosis: Muscle weakness (generalized) (M62.81);Difficulty in walking, not elsewhere classified (R26.2)     Time: 1135-1200 PT Time Calculation (min) (ACUTE ONLY): 25 min  Charges:  $Gait Training: 8-22 mins $Therapeutic Exercise: 8-22 mins                     Rica Koyanagi  PTA Acute  Rehabilitation Services Pager      [phone] Office      [phone]

## 2019-09-05 NOTE — Progress Notes (Signed)
   Subjective: 1 Day Post-Op Procedure(s) (LRB): TOTAL KNEE ARTHROPLASTY (Right) Patient reports pain as mild.   Patient seen in rounds by Dr. Wynelle Link. Patient is well, and has had no acute complaints or problems. No issues overnight. Denies chest pain, SOB, or calf pain. Foley catheter to be removed this AM. We will continue therapy today, ambulated 30' yesterday.   Objective: Vital signs in last 24 hours: Temp:  [97.6 F (36.4 C)-98 F (36.7 C)] 97.6 F (36.4 C) (07/27 0600) Pulse Rate:  [66-88] 71 (07/27 0600) Resp:  [10-20] 15 (07/27 0600) BP: (105-174)/(67-90) 105/69 (07/27 0600) SpO2:  [96 %-100 %] 97 % (07/27 0600)  Intake/Output from previous day:  Intake/Output Summary (Last 24 hours) at 09/05/2019 0739 Last data filed at 09/05/2019 0600 Gross per 24 hour  Intake 3281.85 ml  Output 3875 ml  Net -593.15 ml     Intake/Output this shift: No intake/output data recorded.  Labs: Recent Labs    09/05/19 0308  HGB 11.5*   Recent Labs    09/05/19 0308  WBC 14.6*  RBC 3.80*  HCT 35.9*  PLT 232   Recent Labs    09/05/19 0308  NA 140  K 3.9  CL 106  CO2 25  BUN 9  CREATININE 0.89  GLUCOSE 185*  CALCIUM 8.7*   No results for input(s): LABPT, INR in the last 72 hours.  Exam: General - Patient is Alert and Oriented Extremity - Neurologically intact Neurovascular intact Sensation intact distally Dorsiflexion/Plantar flexion intact Dressing - dressing C/D/I Motor Function - intact, moving foot and toes well on exam.   Past Medical History:  Diagnosis Date  . Allergy    seasonal  . Arthritis   . Complication of anesthesia   . Hypertension   . Hypothyroid   . PONV (postoperative nausea and vomiting)     Assessment/Plan: 1 Day Post-Op Procedure(s) (LRB): TOTAL KNEE ARTHROPLASTY (Right) Active Problems:   Primary osteoarthritis of right knee  Estimated body mass index is 25.29 kg/m as calculated from the following:   Height as of this  encounter: 5\' 5"  (1.651 m).   Weight as of this encounter: 68.9 kg. Advance diet Up with therapy D/C IV fluids   Patient's anticipated LOS is less than 2 midnights, meeting these requirements: - Lives within 1 hour of care - Has a competent adult at home to recover with post-op recover - NO history of  - Chronic pain requiring opiods  - Diabetes  - Coronary Artery Disease  - Heart failure  - Heart attack  - Stroke  - DVT/VTE  - Cardiac arrhythmia  - Respiratory Failure/COPD  - Renal failure  - Anemia  - Advanced Liver disease  DVT Prophylaxis - Aspirin Weight bearing as tolerated. Continue therapy.  BP soft overnight, 250 mL bolus ordered.  Plan is to go Home after hospital stay. Plan for discharge later today if progresses with therapy and is meeting her goals. Scheduled for outpatient physical therapy at [facility]. Follow-up in the office August 10th.  The PDMP database was reviewed today (09/05/2019) prior to any opioid medications being prescribed to this patient.   Theresa Duty, PA-C Orthopedic Surgery [phone] 09/05/2019, 7:39 AM

## 2019-09-06 NOTE — Discharge Summary (Signed)
Physician Discharge Summary   Patient ID: Sheryl Hamilton MRN: [id number] DOB/AGE: 13-Feb-1950 69 y.o.  Admit date: 09/04/2019 Discharge date: 09/05/2019  Primary Diagnosis: Osteoarthritis, right knee   Admission Diagnoses:  Past Medical History:  Diagnosis Date   Allergy    seasonal   Arthritis    Complication of anesthesia    Hypertension    Hypothyroid    PONV (postoperative nausea and vomiting)    Discharge Diagnoses:   Active Problems:   Primary osteoarthritis of right knee  Estimated body mass index is 25.29 kg/m as calculated from the following:   Height as of this encounter: 5\' 5"  (1.651 m).   Weight as of this encounter: 68.9 kg.  Procedure:  Procedure(s) (LRB): TOTAL KNEE ARTHROPLASTY (Right)   Consults: None  HPI: Sheryl Hamilton is a 69 y.o. year old female with end stage OA of her right knee with progressively worsening pain and dysfunction. She has constant pain, with activity and at rest and significant functional deficits with difficulties even with ADLs. She has had extensive non-op management including analgesics, injections of cortisone and viscosupplements, and home exercise program, but remains in significant pain with significant dysfunction.Radiographs show bone on bone arthritis medial and patellofemoral. She presents now for right Total Knee Arthroplasty.     Laboratory Data: Admission on 09/04/2019, Discharged on 09/05/2019  Component Date Value Ref Range Status   WBC 09/05/2019 14.6* 4.0 - 10.5 K/uL Final   RBC 09/05/2019 3.80* 3.87 - 5.11 MIL/uL Final   Hemoglobin 09/05/2019 11.5* 12.0 - 15.0 g/dL Final   HCT 09/05/2019 35.9* 36 - 46 % Final   MCV 09/05/2019 94.5  80.0 - 100.0 fL Final   MCH 09/05/2019 30.3  26.0 - 34.0 pg Final   MCHC 09/05/2019 32.0  30.0 - 36.0 g/dL Final   RDW 09/05/2019 14.3  11.5 - 15.5 % Final   Platelets 09/05/2019 232  150 - 400 K/uL Final   nRBC 09/05/2019 0.0  0.0 - 0.2 % Final   Performed at [facility], [place] [address], [place], [place] [address]   Sodium 09/05/2019 140  135 - 145 mmol/L Final   Potassium 09/05/2019 3.9  3.5 - 5.1 mmol/L Final   Chloride 09/05/2019 106  98 - 111 mmol/L Final   CO2 09/05/2019 25  22 - 32 mmol/L Final   Glucose, Bld 09/05/2019 185* 70 - 99 mg/dL Final   Glucose reference range applies only to samples taken after fasting for at least 8 hours.   BUN 09/05/2019 9  8 - 23 mg/dL Final   Creatinine, Ser 09/05/2019 0.89  0.44 - 1.00 mg/dL Final   Calcium 09/05/2019 8.7* 8.9 - 10.3 mg/dL Final   GFR calc non Af Amer 09/05/2019 >60  >60 mL/min Final   GFR calc Af Amer 09/05/2019 >60  >60 mL/min Final   Anion gap 09/05/2019 9  5 - 15 Final   Performed at [facility], [place] [address] [place], [place] [address]  Hospital Outpatient Visit on 09/02/2019  Component Date Value Ref Range Status   SARS Coronavirus 2 09/02/2019 NEGATIVE  NEGATIVE Final   Comment: (NOTE) SARS-CoV-2 target nucleic acids are NOT DETECTED.  The SARS-CoV-2 RNA is generally detectable in upper and lower respiratory specimens during the acute phase of infection. Negative results do not preclude SARS-CoV-2 infection, do not rule out co-infections with other pathogens, and should not be used as the sole basis for treatment or other patient management decisions.  Negative results must be combined with clinical observations, patient history, and epidemiological information. The expected result is Negative.  Fact Sheet for Patients: SugarRoll.be  Fact Sheet for Healthcare Providers: https://www.woods-mathews.com/  This test is not yet approved or cleared by the [place] FDA and  has been authorized for detection and/or diagnosis of SARS-CoV-2 by FDA under an Emergency Use Authorization (EUA). This EUA will remain  in effect (meaning this test can be used) for the duration of the COVID-19  declaration under Se                          ction 564(b)(1) of the Act, 21 U.S.C. section 360bbb-3(b)(1), unless the authorization is terminated or revoked sooner.  Performed at [facility], [place] [address] [place], [place] [address]   Hospital Outpatient Visit on 08/23/2019  Component Date Value Ref Range Status   MRSA, PCR 08/23/2019 NEGATIVE  NEGATIVE Final   Staphylococcus aureus 08/23/2019 NEGATIVE  NEGATIVE Final   Comment: (NOTE) The Xpert SA Assay (FDA approved for NASAL specimens in patients 19 years of age and older), is one component of a comprehensive surveillance program. It is not intended to diagnose infection nor to guide or monitor treatment. Performed at [facility], [place] [address], [place], [place] [address]    aPTT 08/23/2019 30  24 - 36 seconds Final   Performed at [facility], [place] [address] [place], [place] [address]   WBC 08/23/2019 6.7  4.0 - 10.5 K/uL Final   RBC 08/23/2019 4.72  3.87 - 5.11 MIL/uL Final   Hemoglobin 08/23/2019 14.4  12.0 - 15.0 g/dL Final   HCT 08/23/2019 44.1  36 - 46 % Final   MCV 08/23/2019 93.4  80.0 - 100.0 fL Final   MCH 08/23/2019 30.5  26.0 - 34.0 pg Final   MCHC 08/23/2019 32.7  30.0 - 36.0 g/dL Final   RDW 08/23/2019 14.1  11.5 - 15.5 % Final   Platelets 08/23/2019 262  150 - 400 K/uL Final   nRBC 08/23/2019 0.0  0.0 - 0.2 % Final   Performed at [facility], [place] [address], [place], [place] [address]   Sodium 08/23/2019 146* 135 - 145 mmol/L Final   Potassium 08/23/2019 4.4  3.5 - 5.1 mmol/L Final   Chloride 08/23/2019 107  98 - 111 mmol/L Final   CO2 08/23/2019 27  22 - 32 mmol/L Final   Glucose, Bld 08/23/2019 108* 70 - 99 mg/dL Final   Glucose reference range applies only to samples taken after fasting for at least 8 hours.   BUN 08/23/2019 10  8 - 23 mg/dL Final   Creatinine, Ser 08/23/2019 0.98  0.44 - 1.00 mg/dL Final   Calcium  08/23/2019 9.3  8.9 - 10.3 mg/dL Final   Total Protein 08/23/2019 7.2  6.5 - 8.1 g/dL Final   Albumin 08/23/2019 4.3  3.5 - 5.0 g/dL Final   AST 08/23/2019 22  15 - 41 U/L Final   ALT 08/23/2019 18  0 - 44 U/L Final   Alkaline Phosphatase 08/23/2019 61  38 - 126 U/L Final   Total Bilirubin 08/23/2019 0.6  0.3 - 1.2 mg/dL Final   GFR calc non Af Amer 08/23/2019 58* >60 mL/min Final   GFR calc Af Amer 08/23/2019 >60  >60 mL/min Final   Anion gap 08/23/2019 12  5 - 15 Final   Performed at [facility],  [place] [address] [place], [place] [address]   Prothrombin Time 08/23/2019 12.9  11.4 - 15.2 seconds Final   INR 08/23/2019 1.0  0.8 - 1.2 Final   Comment: (NOTE) INR goal varies based on device and disease states. Performed at [facility], [place] [address] [place], [place] [address]    ABO/RH(D) 08/23/2019 O POS   Final   Antibody Screen 08/23/2019 NEG   Final   Sample Expiration 08/23/2019 09/07/2019,2359   Final   Extend sample reason 08/23/2019    Final                   Value:NO TRANSFUSIONS OR PREGNANCY IN THE PAST 3 MONTHS Performed at [place] [address], [place], [place] [address]      X-Rays:No results found.  EKG: Orders placed or performed during the hospital encounter of 08/23/19   EKG 12 lead per protocol   EKG 12 lead per protocol     Hospital Course: Sheryl Hamilton is a 69 y.o. who was admitted to [facility]. They were brought to the operating room on 09/04/2019 and underwent Procedure(s): TOTAL KNEE ARTHROPLASTY.  Patient tolerated the procedure well and was later transferred to the recovery room and then to the orthopaedic floor for postoperative care. They were given PO and IV analgesics for pain control following their surgery. They were given 24 hours of postoperative antibiotics of  Anti-infectives (From admission, onward)   Start     Dose/Rate Route Frequency Ordered Stop    09/04/19 1430  ceFAZolin (ANCEF) IVPB 2g/100 mL premix        2 g 200 mL/hr over 30 Minutes Intravenous Every 6 hours 09/04/19 1057 09/04/19 2035   09/04/19 0615  ceFAZolin (ANCEF) IVPB 2g/100 mL premix        2 g 200 mL/hr over 30 Minutes Intravenous On call to O.R. 09/04/19 5366 09/04/19 0831     and started on DVT prophylaxis in the form of Aspirin.   PT and OT were ordered for total joint protocol. Discharge planning consulted to help with postop disposition and equipment needs.  Patient had a good night on the evening of surgery. They started to get up OOB with therapy on POD #0. Pt was seen during rounds and was ready to go home pending progress with therapy. She worked with therapy on POD #1 and was meeting her goals. Pt was discharged to home later that day in stable condition.  Diet: Regular diet Activity: WBAT Follow-up: in 2 weeks Disposition: Home with outpatient PT at [facility] Discharged Condition: stable   Discharge Instructions    Call MD / Call 911   Complete by: As directed    If you experience chest pain or shortness of breath, CALL 911 and be transported to the hospital emergency room.  If you develope a fever above 101 F, pus (white drainage) or increased drainage or redness at the wound, or calf pain, call your surgeon's office.   Change dressing   Complete by: As directed    You may remove the bulky bandage (ACE wrap and gauze) two days after surgery. You will have an adhesive waterproof bandage underneath. Leave this in place until your first follow-up appointment.   Constipation Prevention   Complete by: As directed    Drink plenty of fluids.  Prune juice may be helpful.  You may use a stool softener, such as Colace (over the counter) 100 mg twice a day.  Use MiraLax (over the counter) for constipation as needed.   Diet - low sodium heart healthy   Complete by: As directed    Do not put a pillow under the knee. Place it under the heel.   Complete by: As  directed    Driving restrictions   Complete by: As directed    No driving for two weeks   TED hose   Complete by: As directed    Use stockings (TED hose) for three weeks on both leg(s).  You may remove them at night for sleeping.   Weight bearing as tolerated   Complete by: As directed      Allergies as of 09/05/2019   No Known Allergies     Medication List    TAKE these medications   aspirin 325 MG EC tablet Take 1 tablet (325 mg total) by mouth 2 (two) times daily for 20 days. Then resume one 81 mg aspirin once a day. What changed:   medication strength  how much to take  when to take this  additional instructions   gabapentin 300 MG capsule Commonly known as: NEURONTIN Take a 300 mg capsule three times a day for two weeks following surgery.Then take a 300 mg capsule two times a day for two weeks. Then take a 300 mg capsule once a day for two weeks. Then discontinue.   levothyroxine 75 MCG tablet Commonly known as: SYNTHROID Take 75 mcg by mouth daily before breakfast.   lisinopril 40 MG tablet Commonly known as: ZESTRIL Take 40 mg by mouth daily.   methocarbamol 500 MG tablet Commonly known as: ROBAXIN Take 1 tablet (500 mg total) by mouth every 6 (six) hours as needed for muscle spasms.   oxyCODONE 5 MG immediate release tablet Commonly known as: Oxy IR/ROXICODONE Take 1-2 tablets (5-10 mg total) by mouth every 6 (six) hours as needed for severe pain. Not to exceed 6 tablets a day.   PARoxetine 10 MG tablet Commonly known as: PAXIL Take 10 mg by mouth daily.   Vitamin D 50 MCG (2000 UT) Caps Take 2,000 Units by mouth daily.            Discharge Care Instructions  (From admission, onward)         Start     Ordered   09/05/19 0000  Weight bearing as tolerated        09/05/19 0744   09/05/19 0000  Change dressing       Comments: You may remove the bulky bandage (ACE wrap and gauze) two days after surgery. You will have an adhesive waterproof  bandage underneath. Leave this in place until your first follow-up appointment.   09/05/19 0744          Follow-up Information    Gaynelle Arabian, MD. Schedule an appointment as soon as possible for a visit on 09/19/2019.   Specialty: Orthopedic Surgery Contact information: [address] [place] [address] [phone]               Signed: Theresa Duty, PA-C Orthopedic Surgery 09/06/2019, 7:44 AM

## 2019-09-07 DIAGNOSIS — M25461 Effusion, right knee: Secondary | ICD-10-CM | POA: Diagnosis not present

## 2019-09-07 DIAGNOSIS — M25661 Stiffness of right knee, not elsewhere classified: Secondary | ICD-10-CM | POA: Diagnosis not present

## 2019-09-07 DIAGNOSIS — M62551 Muscle wasting and atrophy, not elsewhere classified, right thigh: Secondary | ICD-10-CM | POA: Diagnosis not present

## 2019-09-07 DIAGNOSIS — M25561 Pain in right knee: Secondary | ICD-10-CM | POA: Diagnosis not present

## 2019-09-11 DIAGNOSIS — M62551 Muscle wasting and atrophy, not elsewhere classified, right thigh: Secondary | ICD-10-CM | POA: Diagnosis not present

## 2019-09-11 DIAGNOSIS — M25561 Pain in right knee: Secondary | ICD-10-CM | POA: Diagnosis not present

## 2019-09-11 DIAGNOSIS — M25661 Stiffness of right knee, not elsewhere classified: Secondary | ICD-10-CM | POA: Diagnosis not present

## 2019-09-11 DIAGNOSIS — M25461 Effusion, right knee: Secondary | ICD-10-CM | POA: Diagnosis not present

## 2019-09-12 DIAGNOSIS — M25661 Stiffness of right knee, not elsewhere classified: Secondary | ICD-10-CM | POA: Diagnosis not present

## 2019-09-12 DIAGNOSIS — M25561 Pain in right knee: Secondary | ICD-10-CM | POA: Diagnosis not present

## 2019-09-12 DIAGNOSIS — M25461 Effusion, right knee: Secondary | ICD-10-CM | POA: Diagnosis not present

## 2019-09-12 DIAGNOSIS — M62551 Muscle wasting and atrophy, not elsewhere classified, right thigh: Secondary | ICD-10-CM | POA: Diagnosis not present

## 2019-09-13 DIAGNOSIS — R300 Dysuria: Secondary | ICD-10-CM | POA: Diagnosis not present

## 2019-09-14 DIAGNOSIS — M25661 Stiffness of right knee, not elsewhere classified: Secondary | ICD-10-CM | POA: Diagnosis not present

## 2019-09-14 DIAGNOSIS — M25561 Pain in right knee: Secondary | ICD-10-CM | POA: Diagnosis not present

## 2019-09-14 DIAGNOSIS — M62551 Muscle wasting and atrophy, not elsewhere classified, right thigh: Secondary | ICD-10-CM | POA: Diagnosis not present

## 2019-09-14 DIAGNOSIS — M25461 Effusion, right knee: Secondary | ICD-10-CM | POA: Diagnosis not present

## 2019-09-18 DIAGNOSIS — M25461 Effusion, right knee: Secondary | ICD-10-CM | POA: Diagnosis not present

## 2019-09-18 DIAGNOSIS — M62551 Muscle wasting and atrophy, not elsewhere classified, right thigh: Secondary | ICD-10-CM | POA: Diagnosis not present

## 2019-09-18 DIAGNOSIS — M25561 Pain in right knee: Secondary | ICD-10-CM | POA: Diagnosis not present

## 2019-09-18 DIAGNOSIS — M25661 Stiffness of right knee, not elsewhere classified: Secondary | ICD-10-CM | POA: Diagnosis not present

## 2019-09-20 DIAGNOSIS — M25561 Pain in right knee: Secondary | ICD-10-CM | POA: Diagnosis not present

## 2019-09-20 DIAGNOSIS — M62551 Muscle wasting and atrophy, not elsewhere classified, right thigh: Secondary | ICD-10-CM | POA: Diagnosis not present

## 2019-09-20 DIAGNOSIS — M25461 Effusion, right knee: Secondary | ICD-10-CM | POA: Diagnosis not present

## 2019-09-20 DIAGNOSIS — M25661 Stiffness of right knee, not elsewhere classified: Secondary | ICD-10-CM | POA: Diagnosis not present

## 2019-09-22 DIAGNOSIS — M62551 Muscle wasting and atrophy, not elsewhere classified, right thigh: Secondary | ICD-10-CM | POA: Diagnosis not present

## 2019-09-22 DIAGNOSIS — M25561 Pain in right knee: Secondary | ICD-10-CM | POA: Diagnosis not present

## 2019-09-22 DIAGNOSIS — M25461 Effusion, right knee: Secondary | ICD-10-CM | POA: Diagnosis not present

## 2019-09-22 DIAGNOSIS — M25661 Stiffness of right knee, not elsewhere classified: Secondary | ICD-10-CM | POA: Diagnosis not present

## 2019-09-25 DIAGNOSIS — M25561 Pain in right knee: Secondary | ICD-10-CM | POA: Diagnosis not present

## 2019-09-25 DIAGNOSIS — M62551 Muscle wasting and atrophy, not elsewhere classified, right thigh: Secondary | ICD-10-CM | POA: Diagnosis not present

## 2019-09-25 DIAGNOSIS — M25661 Stiffness of right knee, not elsewhere classified: Secondary | ICD-10-CM | POA: Diagnosis not present

## 2019-09-25 DIAGNOSIS — M25461 Effusion, right knee: Secondary | ICD-10-CM | POA: Diagnosis not present

## 2019-09-27 DIAGNOSIS — M25561 Pain in right knee: Secondary | ICD-10-CM | POA: Diagnosis not present

## 2019-09-27 DIAGNOSIS — M62551 Muscle wasting and atrophy, not elsewhere classified, right thigh: Secondary | ICD-10-CM | POA: Diagnosis not present

## 2019-09-27 DIAGNOSIS — M25661 Stiffness of right knee, not elsewhere classified: Secondary | ICD-10-CM | POA: Diagnosis not present

## 2019-09-27 DIAGNOSIS — M25461 Effusion, right knee: Secondary | ICD-10-CM | POA: Diagnosis not present

## 2019-10-02 DIAGNOSIS — M25461 Effusion, right knee: Secondary | ICD-10-CM | POA: Diagnosis not present

## 2019-10-02 DIAGNOSIS — M25561 Pain in right knee: Secondary | ICD-10-CM | POA: Diagnosis not present

## 2019-10-02 DIAGNOSIS — M62551 Muscle wasting and atrophy, not elsewhere classified, right thigh: Secondary | ICD-10-CM | POA: Diagnosis not present

## 2019-10-02 DIAGNOSIS — M25661 Stiffness of right knee, not elsewhere classified: Secondary | ICD-10-CM | POA: Diagnosis not present

## 2019-10-04 DIAGNOSIS — M25661 Stiffness of right knee, not elsewhere classified: Secondary | ICD-10-CM | POA: Diagnosis not present

## 2019-10-04 DIAGNOSIS — M25561 Pain in right knee: Secondary | ICD-10-CM | POA: Diagnosis not present

## 2019-10-04 DIAGNOSIS — M62551 Muscle wasting and atrophy, not elsewhere classified, right thigh: Secondary | ICD-10-CM | POA: Diagnosis not present

## 2019-10-04 DIAGNOSIS — M25461 Effusion, right knee: Secondary | ICD-10-CM | POA: Diagnosis not present

## 2019-10-09 DIAGNOSIS — R319 Hematuria, unspecified: Secondary | ICD-10-CM | POA: Diagnosis not present

## 2019-10-09 DIAGNOSIS — E782 Mixed hyperlipidemia: Secondary | ICD-10-CM | POA: Diagnosis not present

## 2019-10-09 DIAGNOSIS — N182 Chronic kidney disease, stage 2 (mild): Secondary | ICD-10-CM | POA: Diagnosis not present

## 2019-10-10 DIAGNOSIS — Z96651 Presence of right artificial knee joint: Secondary | ICD-10-CM | POA: Diagnosis not present

## 2019-10-10 DIAGNOSIS — Z471 Aftercare following joint replacement surgery: Secondary | ICD-10-CM | POA: Diagnosis not present

## 2019-10-24 DIAGNOSIS — Z6825 Body mass index (BMI) 25.0-25.9, adult: Secondary | ICD-10-CM | POA: Diagnosis not present

## 2019-10-24 DIAGNOSIS — E039 Hypothyroidism, unspecified: Secondary | ICD-10-CM | POA: Diagnosis not present

## 2019-10-24 DIAGNOSIS — E782 Mixed hyperlipidemia: Secondary | ICD-10-CM | POA: Diagnosis not present

## 2019-10-24 DIAGNOSIS — I10 Essential (primary) hypertension: Secondary | ICD-10-CM | POA: Diagnosis not present

## 2019-10-24 DIAGNOSIS — Z96651 Presence of right artificial knee joint: Secondary | ICD-10-CM | POA: Diagnosis not present

## 2019-10-24 DIAGNOSIS — Z1331 Encounter for screening for depression: Secondary | ICD-10-CM | POA: Diagnosis not present

## 2019-10-24 DIAGNOSIS — N182 Chronic kidney disease, stage 2 (mild): Secondary | ICD-10-CM | POA: Diagnosis not present

## 2019-10-24 DIAGNOSIS — Z1389 Encounter for screening for other disorder: Secondary | ICD-10-CM | POA: Diagnosis not present

## 2019-12-01 DIAGNOSIS — Z96651 Presence of right artificial knee joint: Secondary | ICD-10-CM | POA: Diagnosis not present

## 2019-12-04 DIAGNOSIS — M1712 Unilateral primary osteoarthritis, left knee: Secondary | ICD-10-CM | POA: Diagnosis not present

## 2019-12-04 DIAGNOSIS — E039 Hypothyroidism, unspecified: Secondary | ICD-10-CM | POA: Diagnosis not present

## 2019-12-04 DIAGNOSIS — N182 Chronic kidney disease, stage 2 (mild): Secondary | ICD-10-CM | POA: Diagnosis not present

## 2019-12-04 DIAGNOSIS — Z6824 Body mass index (BMI) 24.0-24.9, adult: Secondary | ICD-10-CM | POA: Diagnosis not present

## 2019-12-04 DIAGNOSIS — Z0001 Encounter for general adult medical examination with abnormal findings: Secondary | ICD-10-CM | POA: Diagnosis not present

## 2019-12-04 DIAGNOSIS — G4700 Insomnia, unspecified: Secondary | ICD-10-CM | POA: Diagnosis not present

## 2019-12-04 DIAGNOSIS — M1711 Unilateral primary osteoarthritis, right knee: Secondary | ICD-10-CM | POA: Diagnosis not present

## 2019-12-04 DIAGNOSIS — F339 Major depressive disorder, recurrent, unspecified: Secondary | ICD-10-CM | POA: Diagnosis not present

## 2020-01-03 DIAGNOSIS — Z96651 Presence of right artificial knee joint: Secondary | ICD-10-CM | POA: Diagnosis not present

## 2020-03-26 DIAGNOSIS — Z1231 Encounter for screening mammogram for malignant neoplasm of breast: Secondary | ICD-10-CM | POA: Diagnosis not present

## 2020-04-19 DIAGNOSIS — E782 Mixed hyperlipidemia: Secondary | ICD-10-CM | POA: Diagnosis not present

## 2020-04-19 DIAGNOSIS — E039 Hypothyroidism, unspecified: Secondary | ICD-10-CM | POA: Diagnosis not present

## 2020-04-19 DIAGNOSIS — N182 Chronic kidney disease, stage 2 (mild): Secondary | ICD-10-CM | POA: Diagnosis not present

## 2020-04-19 DIAGNOSIS — K219 Gastro-esophageal reflux disease without esophagitis: Secondary | ICD-10-CM | POA: Diagnosis not present

## 2020-04-19 DIAGNOSIS — E7849 Other hyperlipidemia: Secondary | ICD-10-CM | POA: Diagnosis not present

## 2020-04-19 DIAGNOSIS — E559 Vitamin D deficiency, unspecified: Secondary | ICD-10-CM | POA: Diagnosis not present

## 2020-04-22 DIAGNOSIS — I10 Essential (primary) hypertension: Secondary | ICD-10-CM | POA: Diagnosis not present

## 2020-04-22 DIAGNOSIS — Z803 Family history of malignant neoplasm of breast: Secondary | ICD-10-CM | POA: Diagnosis not present

## 2020-04-22 DIAGNOSIS — Z23 Encounter for immunization: Secondary | ICD-10-CM | POA: Diagnosis not present

## 2020-04-22 DIAGNOSIS — N182 Chronic kidney disease, stage 2 (mild): Secondary | ICD-10-CM | POA: Diagnosis not present

## 2020-04-22 DIAGNOSIS — F339 Major depressive disorder, recurrent, unspecified: Secondary | ICD-10-CM | POA: Diagnosis not present

## 2020-04-22 DIAGNOSIS — E7849 Other hyperlipidemia: Secondary | ICD-10-CM | POA: Diagnosis not present

## 2020-04-22 DIAGNOSIS — E039 Hypothyroidism, unspecified: Secondary | ICD-10-CM | POA: Diagnosis not present

## 2020-04-22 DIAGNOSIS — Z0001 Encounter for general adult medical examination with abnormal findings: Secondary | ICD-10-CM | POA: Diagnosis not present

## 2020-04-24 DIAGNOSIS — Z23 Encounter for immunization: Secondary | ICD-10-CM | POA: Diagnosis not present

## 2020-05-08 DIAGNOSIS — Z1212 Encounter for screening for malignant neoplasm of rectum: Secondary | ICD-10-CM | POA: Diagnosis not present

## 2020-05-08 DIAGNOSIS — Z1211 Encounter for screening for malignant neoplasm of colon: Secondary | ICD-10-CM | POA: Diagnosis not present

## 2020-05-14 DIAGNOSIS — Z6824 Body mass index (BMI) 24.0-24.9, adult: Secondary | ICD-10-CM | POA: Diagnosis not present

## 2020-05-14 DIAGNOSIS — J340 Abscess, furuncle and carbuncle of nose: Secondary | ICD-10-CM | POA: Diagnosis not present

## 2020-05-15 LAB — COLOGUARD: COLOGUARD: NEGATIVE

## 2020-05-15 LAB — EXTERNAL GENERIC LAB PROCEDURE: COLOGUARD: NEGATIVE

## 2020-05-22 DIAGNOSIS — H905 Unspecified sensorineural hearing loss: Secondary | ICD-10-CM | POA: Diagnosis not present

## 2020-07-04 DIAGNOSIS — Z6825 Body mass index (BMI) 25.0-25.9, adult: Secondary | ICD-10-CM | POA: Diagnosis not present

## 2020-07-04 DIAGNOSIS — L659 Nonscarring hair loss, unspecified: Secondary | ICD-10-CM | POA: Diagnosis not present

## 2020-07-14 DIAGNOSIS — R531 Weakness: Secondary | ICD-10-CM | POA: Diagnosis not present

## 2020-07-14 DIAGNOSIS — R1110 Vomiting, unspecified: Secondary | ICD-10-CM | POA: Diagnosis not present

## 2020-07-14 DIAGNOSIS — Z20822 Contact with and (suspected) exposure to covid-19: Secondary | ICD-10-CM | POA: Diagnosis not present

## 2020-07-14 DIAGNOSIS — I10 Essential (primary) hypertension: Secondary | ICD-10-CM | POA: Diagnosis not present

## 2020-07-14 DIAGNOSIS — Z96651 Presence of right artificial knee joint: Secondary | ICD-10-CM | POA: Diagnosis not present

## 2020-07-14 DIAGNOSIS — M1990 Unspecified osteoarthritis, unspecified site: Secondary | ICD-10-CM | POA: Diagnosis not present

## 2020-07-14 DIAGNOSIS — E860 Dehydration: Secondary | ICD-10-CM | POA: Diagnosis not present

## 2020-07-14 DIAGNOSIS — Z79899 Other long term (current) drug therapy: Secondary | ICD-10-CM | POA: Diagnosis not present

## 2020-07-14 DIAGNOSIS — R059 Cough, unspecified: Secondary | ICD-10-CM | POA: Diagnosis not present

## 2020-07-14 DIAGNOSIS — R519 Headache, unspecified: Secondary | ICD-10-CM | POA: Diagnosis not present

## 2020-07-14 DIAGNOSIS — Z79890 Hormone replacement therapy (postmenopausal): Secondary | ICD-10-CM | POA: Diagnosis not present

## 2020-07-14 DIAGNOSIS — R21 Rash and other nonspecific skin eruption: Secondary | ICD-10-CM | POA: Diagnosis not present

## 2020-07-14 DIAGNOSIS — M6282 Rhabdomyolysis: Secondary | ICD-10-CM | POA: Diagnosis not present

## 2020-07-14 DIAGNOSIS — N179 Acute kidney failure, unspecified: Secondary | ICD-10-CM | POA: Diagnosis not present

## 2020-07-14 DIAGNOSIS — M1711 Unilateral primary osteoarthritis, right knee: Secondary | ICD-10-CM | POA: Diagnosis not present

## 2020-07-14 DIAGNOSIS — J40 Bronchitis, not specified as acute or chronic: Secondary | ICD-10-CM | POA: Diagnosis not present

## 2020-07-14 DIAGNOSIS — E039 Hypothyroidism, unspecified: Secondary | ICD-10-CM | POA: Diagnosis not present

## 2020-07-23 DIAGNOSIS — R748 Abnormal levels of other serum enzymes: Secondary | ICD-10-CM | POA: Diagnosis not present

## 2020-07-23 DIAGNOSIS — N179 Acute kidney failure, unspecified: Secondary | ICD-10-CM | POA: Diagnosis not present

## 2020-07-23 DIAGNOSIS — M79641 Pain in right hand: Secondary | ICD-10-CM | POA: Diagnosis not present

## 2020-07-23 DIAGNOSIS — M1990 Unspecified osteoarthritis, unspecified site: Secondary | ICD-10-CM | POA: Diagnosis not present

## 2020-07-23 DIAGNOSIS — M19041 Primary osteoarthritis, right hand: Secondary | ICD-10-CM | POA: Diagnosis not present

## 2020-07-23 DIAGNOSIS — M545 Low back pain, unspecified: Secondary | ICD-10-CM | POA: Diagnosis not present

## 2020-07-23 DIAGNOSIS — R21 Rash and other nonspecific skin eruption: Secondary | ICD-10-CM | POA: Diagnosis not present

## 2020-07-23 DIAGNOSIS — M19042 Primary osteoarthritis, left hand: Secondary | ICD-10-CM | POA: Diagnosis not present

## 2020-07-23 DIAGNOSIS — M542 Cervicalgia: Secondary | ICD-10-CM | POA: Diagnosis not present

## 2020-07-23 DIAGNOSIS — M609 Myositis, unspecified: Secondary | ICD-10-CM | POA: Diagnosis not present

## 2020-07-23 DIAGNOSIS — I7300 Raynaud's syndrome without gangrene: Secondary | ICD-10-CM | POA: Diagnosis not present

## 2020-07-23 DIAGNOSIS — M79642 Pain in left hand: Secondary | ICD-10-CM | POA: Diagnosis not present

## 2020-07-23 DIAGNOSIS — M549 Dorsalgia, unspecified: Secondary | ICD-10-CM | POA: Diagnosis not present

## 2020-07-23 DIAGNOSIS — M546 Pain in thoracic spine: Secondary | ICD-10-CM | POA: Diagnosis not present

## 2020-07-30 DIAGNOSIS — R748 Abnormal levels of other serum enzymes: Secondary | ICD-10-CM | POA: Diagnosis not present

## 2020-07-30 DIAGNOSIS — R21 Rash and other nonspecific skin eruption: Secondary | ICD-10-CM | POA: Diagnosis not present

## 2020-07-30 DIAGNOSIS — I7300 Raynaud's syndrome without gangrene: Secondary | ICD-10-CM | POA: Diagnosis not present

## 2020-08-06 DIAGNOSIS — M1990 Unspecified osteoarthritis, unspecified site: Secondary | ICD-10-CM | POA: Diagnosis not present

## 2020-08-06 DIAGNOSIS — R748 Abnormal levels of other serum enzymes: Secondary | ICD-10-CM | POA: Diagnosis not present

## 2020-08-06 DIAGNOSIS — Z79899 Other long term (current) drug therapy: Secondary | ICD-10-CM | POA: Diagnosis not present

## 2020-08-06 DIAGNOSIS — R21 Rash and other nonspecific skin eruption: Secondary | ICD-10-CM | POA: Diagnosis not present

## 2020-08-06 DIAGNOSIS — M47814 Spondylosis without myelopathy or radiculopathy, thoracic region: Secondary | ICD-10-CM | POA: Diagnosis not present

## 2020-08-06 DIAGNOSIS — M609 Myositis, unspecified: Secondary | ICD-10-CM | POA: Diagnosis not present

## 2020-08-06 DIAGNOSIS — R7611 Nonspecific reaction to tuberculin skin test without active tuberculosis: Secondary | ICD-10-CM | POA: Diagnosis not present

## 2020-08-06 DIAGNOSIS — N179 Acute kidney failure, unspecified: Secondary | ICD-10-CM | POA: Diagnosis not present

## 2020-08-06 DIAGNOSIS — I7300 Raynaud's syndrome without gangrene: Secondary | ICD-10-CM | POA: Diagnosis not present

## 2020-08-17 DIAGNOSIS — I10 Essential (primary) hypertension: Secondary | ICD-10-CM | POA: Diagnosis not present

## 2020-08-17 DIAGNOSIS — M6282 Rhabdomyolysis: Secondary | ICD-10-CM | POA: Diagnosis not present

## 2020-08-17 DIAGNOSIS — N182 Chronic kidney disease, stage 2 (mild): Secondary | ICD-10-CM | POA: Diagnosis not present

## 2020-08-17 DIAGNOSIS — E039 Hypothyroidism, unspecified: Secondary | ICD-10-CM | POA: Diagnosis not present

## 2020-08-19 ENCOUNTER — Ambulatory Visit (INDEPENDENT_AMBULATORY_CARE_PROVIDER_SITE_OTHER): Payer: PPO | Admitting: Neurology

## 2020-08-19 ENCOUNTER — Other Ambulatory Visit: Payer: Self-pay | Admitting: Neurology

## 2020-08-19 ENCOUNTER — Ambulatory Visit: Payer: PPO | Admitting: Neurology

## 2020-08-19 DIAGNOSIS — M6281 Muscle weakness (generalized): Secondary | ICD-10-CM

## 2020-08-19 DIAGNOSIS — R748 Abnormal levels of other serum enzymes: Secondary | ICD-10-CM

## 2020-08-19 DIAGNOSIS — Z0289 Encounter for other administrative examinations: Secondary | ICD-10-CM

## 2020-08-19 DIAGNOSIS — R531 Weakness: Secondary | ICD-10-CM

## 2020-08-19 NOTE — Progress Notes (Signed)
See procedure note.

## 2020-08-19 NOTE — Progress Notes (Signed)
Full Name: Sheryl Hamilton Gender: Female MRN #: [id number] Date of Birth: [birth year]    Visit Date: 08/19/2020 13:19 Age: 70 Years Examining Physician: Sarina Ill, MD  Requesting Physician: Dr. Kathlene November  History: Weakness and elevated CK  Summary: EMG performed on the right upper and right lower extremities and paraspinal muscles.All nerves and muscles (as indicated in the following tables) were within normal limits.       Conclusion: This is a normal study. No electrophysiologic evidence for neuropathy, radiculopathy, myopathy/myositis or other neuromuscular disease.   Sarina Ill M.D.  [facility] [address] [place], [place] [address] Tel: [phone] Fax: [phone]  Verbal informed consent was obtained from the patient, patient was informed of potential risk of procedure, including bruising, bleeding, hematoma formation, infection, muscle weakness, muscle pain, numbness, among others.        [place]    Nerve / Sites Muscle Latency Ref. Amplitude Ref. Rel Amp Segments Distance Velocity Ref. Area    ms ms mV mV %  cm m/s m/s mVms  R Median - APB     Wrist APB 3.7 ?4.4 4.5 ?4.0 100 Wrist - APB 7   13.6     Upper arm APB 7.6  4.9  109 Upper arm - Wrist 21 55 ?49 15.8  R Ulnar - ADM     Wrist ADM 3.1 ?3.3 8.3 ?6.0 100 Wrist - ADM 7   28.0     B.Elbow ADM 6.3  6.2  75.2 B.Elbow - Wrist 19 59 ?49 25.0     A.Elbow ADM 8.1  6.0  96 A.Elbow - B.Elbow 10 56 ?49 23.8  R Peroneal - EDB     Ankle EDB 5.0 ?6.5 4.2 ?2.0 100 Ankle - EDB 9   15.4     Fib head EDB 10.8  3.8  89.5 Fib head - Ankle 26 45 ?44 14.6     Pop fossa EDB 13.1  3.6  96.4 Pop fossa - Fib head 10 44 ?44 14.3         Pop fossa - Ankle      R Tibial - AH     Ankle AH 3.5 ?5.8 13.9 ?4.0 100 Ankle - AH 9   30.9     Pop fossa AH 12.2  10.9  78.5 Pop fossa - Ankle 39 45 ?41 27.4             SNC    Nerve / Sites Rec. Site Peak Lat Ref.  Amp Ref. Segments Distance Peak Diff Ref.     ms ms V V  cm ms ms  R Sural - Ankle (Calf)     Calf Ankle 3.8 ?4.4 14 ?6 Calf - Ankle 14    R Superficial peroneal - Ankle     Lat leg Ankle 4.0 ?4.4 14 ?6 Lat leg - Ankle 14    R Median, Ulnar - Transcarpal comparison     Median Palm Wrist 2.3 ?2.2 57 ?35 Median Palm - Wrist 8       Ulnar Palm Wrist 2.1 ?2.2 23 ?12 Ulnar Palm - Wrist 8          Median Palm - Ulnar Palm  0.2 ?0.4  R Median - Orthodromic (Dig II, Mid palm)     Dig II Wrist 3.3 ?3.4 15 ?10 Dig II - Wrist 13    R Ulnar - Orthodromic, (Dig V, Mid palm)     Dig  V Wrist 2.9 ?3.1 10 ?5 Dig V - Wrist 35                 F  Wave    Nerve F Lat Ref.   ms ms  R Tibial - AH 51.7 ?56.0  R Ulnar - ADM 29.1 ?32.0         EMG Summary Table    Spontaneous MUAP Recruitment  Muscle IA Fib PSW Fasc Other Amp Dur. Poly Pattern  R. Deltoid Normal None None None _______ Normal Normal Normal Normal  R. Biceps brachii Normal None None None _______ Normal Normal Normal Normal  R. Trapezius Normal None None None _______ Normal Normal Normal Normal  R. Pronator teres Normal None None None _______ Normal Normal Normal Normal  R. First dorsal interosseous Normal None None None _______ Normal Normal Normal Normal  R. Iliopsoas Normal None None None _______ Normal Normal Normal Normal  R. Vastus medialis Normal None None None _______ Normal Normal Normal Normal  R. Tibialis anterior Normal None None None _______ Normal Normal Normal Normal  R. Gastrocnemius (Medial head) Normal None None None _______ Normal Normal Normal Normal  R. Extensor hallucis longus Normal None None None _______ Normal Normal Normal Normal  R. Cervical paraspinals (low) Normal None None None _______ Normal Normal Normal Normal  R. Thoracic paraspinals Normal None None None _______ Normal Normal Normal Normal  R. Lumbar paraspinals (low) Normal None None None _______ Normal Normal Normal Normal

## 2020-08-20 NOTE — Procedures (Signed)
Full Name: Sheryl Hamilton Gender: Female MRN #: [id number] Date of Birth: Sep 25, 1950    Visit Date: 08/19/2020 13:19 Age: 70 Years Examining Physician: Sarina Ill, MD  Requesting Physician: Dr. Kathlene November  History: Weakness and elevated CK  Summary: EMG performed on the right upper and right lower extremities and paraspinal muscles.All nerves and muscles (as indicated in the following tables) were within normal limits.       Conclusion: This is a normal study. No electrophysiologic evidence for neuropathy, radiculopathy, myopathy/myositis or other neuromuscular disease.   Sarina Ill M.D.  [facility] [address] [place], [place] [address] Tel: [phone] Fax: [phone]  Verbal informed consent was obtained from the patient, patient was informed of potential risk of procedure, including bruising, bleeding, hematoma formation, infection, muscle weakness, muscle pain, numbness, among others.        [place]    Nerve / Sites Muscle Latency Ref. Amplitude Ref. Rel Amp Segments Distance Velocity Ref. Area    ms ms mV mV %  cm m/s m/s mVms  R Median - APB     Wrist APB 3.7 ?4.4 4.5 ?4.0 100 Wrist - APB 7   13.6     Upper arm APB 7.6  4.9  109 Upper arm - Wrist 21 55 ?49 15.8  R Ulnar - ADM     Wrist ADM 3.1 ?3.3 8.3 ?6.0 100 Wrist - ADM 7   28.0     B.Elbow ADM 6.3  6.2  75.2 B.Elbow - Wrist 19 59 ?49 25.0     A.Elbow ADM 8.1  6.0  96 A.Elbow - B.Elbow 10 56 ?49 23.8  R Peroneal - EDB     Ankle EDB 5.0 ?6.5 4.2 ?2.0 100 Ankle - EDB 9   15.4     Fib head EDB 10.8  3.8  89.5 Fib head - Ankle 26 45 ?44 14.6     Pop fossa EDB 13.1  3.6  96.4 Pop fossa - Fib head 10 44 ?44 14.3         Pop fossa - Ankle      R Tibial - AH     Ankle AH 3.5 ?5.8 13.9 ?4.0 100 Ankle - AH 9   30.9     Pop fossa AH 12.2  10.9  78.5 Pop fossa - Ankle 39 45 ?41 27.4             SNC    Nerve / Sites Rec. Site Peak Lat Ref.  Amp Ref. Segments Distance Peak Diff Ref.     ms ms V V  cm ms ms  R Sural - Ankle (Calf)     Calf Ankle 3.8 ?4.4 14 ?6 Calf - Ankle 14    R Superficial peroneal - Ankle     Lat leg Ankle 4.0 ?4.4 14 ?6 Lat leg - Ankle 14    R Median, Ulnar - Transcarpal comparison     Median Palm Wrist 2.3 ?2.2 57 ?35 Median Palm - Wrist 8       Ulnar Palm Wrist 2.1 ?2.2 23 ?12 Ulnar Palm - Wrist 8          Median Palm - Ulnar Palm  0.2 ?0.4  R Median - Orthodromic (Dig II, Mid palm)     Dig II Wrist 3.3 ?3.4 15 ?10 Dig II - Wrist 13    R Ulnar - Orthodromic, (Dig V, Mid palm)     Dig  V Wrist 2.9 ?3.1 10 ?5 Dig V - Wrist 71                 F  Wave    Nerve F Lat Ref.   ms ms  R Tibial - AH 51.7 ?56.0  R Ulnar - ADM 29.1 ?32.0         EMG Summary Table    Spontaneous MUAP Recruitment  Muscle IA Fib PSW Fasc Other Amp Dur. Poly Pattern  R. Deltoid Normal None None None _______ Normal Normal Normal Normal  R. Biceps brachii Normal None None None _______ Normal Normal Normal Normal  R. Trapezius Normal None None None _______ Normal Normal Normal Normal  R. Pronator teres Normal None None None _______ Normal Normal Normal Normal  R. First dorsal interosseous Normal None None None _______ Normal Normal Normal Normal  R. Iliopsoas Normal None None None _______ Normal Normal Normal Normal  R. Vastus medialis Normal None None None _______ Normal Normal Normal Normal  R. Tibialis anterior Normal None None None _______ Normal Normal Normal Normal  R. Gastrocnemius (Medial head) Normal None None None _______ Normal Normal Normal Normal  R. Extensor hallucis longus Normal None None None _______ Normal Normal Normal Normal  R. Cervical paraspinals (low) Normal None None None _______ Normal Normal Normal Normal  R. Thoracic paraspinals Normal None None None _______ Normal Normal Normal Normal  R. Lumbar paraspinals (low) Normal None None None _______ Normal Normal Normal Normal

## 2020-08-27 DIAGNOSIS — Z79899 Other long term (current) drug therapy: Secondary | ICD-10-CM | POA: Diagnosis not present

## 2020-08-27 DIAGNOSIS — M1990 Unspecified osteoarthritis, unspecified site: Secondary | ICD-10-CM | POA: Diagnosis not present

## 2020-08-27 DIAGNOSIS — M609 Myositis, unspecified: Secondary | ICD-10-CM | POA: Diagnosis not present

## 2020-08-27 DIAGNOSIS — N179 Acute kidney failure, unspecified: Secondary | ICD-10-CM | POA: Diagnosis not present

## 2020-08-27 DIAGNOSIS — R21 Rash and other nonspecific skin eruption: Secondary | ICD-10-CM | POA: Diagnosis not present

## 2020-08-27 DIAGNOSIS — R748 Abnormal levels of other serum enzymes: Secondary | ICD-10-CM | POA: Diagnosis not present

## 2020-08-27 DIAGNOSIS — M329 Systemic lupus erythematosus, unspecified: Secondary | ICD-10-CM | POA: Diagnosis not present

## 2020-08-27 DIAGNOSIS — I7300 Raynaud's syndrome without gangrene: Secondary | ICD-10-CM | POA: Diagnosis not present

## 2020-09-13 DIAGNOSIS — M609 Myositis, unspecified: Secondary | ICD-10-CM | POA: Diagnosis not present

## 2020-09-13 DIAGNOSIS — M6250 Muscle wasting and atrophy, not elsewhere classified, unspecified site: Secondary | ICD-10-CM | POA: Diagnosis not present

## 2020-09-19 DIAGNOSIS — M1990 Unspecified osteoarthritis, unspecified site: Secondary | ICD-10-CM | POA: Diagnosis not present

## 2020-09-19 DIAGNOSIS — N179 Acute kidney failure, unspecified: Secondary | ICD-10-CM | POA: Diagnosis not present

## 2020-09-19 DIAGNOSIS — R748 Abnormal levels of other serum enzymes: Secondary | ICD-10-CM | POA: Diagnosis not present

## 2020-09-19 DIAGNOSIS — M609 Myositis, unspecified: Secondary | ICD-10-CM | POA: Diagnosis not present

## 2020-09-19 DIAGNOSIS — Z79899 Other long term (current) drug therapy: Secondary | ICD-10-CM | POA: Diagnosis not present

## 2020-09-19 DIAGNOSIS — R21 Rash and other nonspecific skin eruption: Secondary | ICD-10-CM | POA: Diagnosis not present

## 2020-09-19 DIAGNOSIS — M329 Systemic lupus erythematosus, unspecified: Secondary | ICD-10-CM | POA: Diagnosis not present

## 2020-09-19 DIAGNOSIS — I7300 Raynaud's syndrome without gangrene: Secondary | ICD-10-CM | POA: Diagnosis not present

## 2020-09-23 ENCOUNTER — Other Ambulatory Visit: Payer: Self-pay | Admitting: Rheumatology

## 2020-09-23 DIAGNOSIS — M609 Myositis, unspecified: Secondary | ICD-10-CM | POA: Diagnosis not present

## 2020-10-10 ENCOUNTER — Other Ambulatory Visit: Payer: Self-pay

## 2020-10-10 ENCOUNTER — Ambulatory Visit
Admission: RE | Admit: 2020-10-10 | Discharge: 2020-10-10 | Disposition: A | Discharge status: Home / Self Care | Payer: PPO | Source: Ambulatory Visit | Attending: Rheumatology | Admitting: Rheumatology

## 2020-10-10 ENCOUNTER — Other Ambulatory Visit: Payer: Self-pay | Admitting: Rheumatology

## 2020-10-10 DIAGNOSIS — I700 Atherosclerosis of aorta: Secondary | ICD-10-CM | POA: Diagnosis not present

## 2020-10-10 DIAGNOSIS — M609 Myositis, unspecified: Secondary | ICD-10-CM

## 2020-10-10 DIAGNOSIS — I2510 Atherosclerotic heart disease of native coronary artery without angina pectoris: Secondary | ICD-10-CM | POA: Diagnosis not present

## 2020-10-10 DIAGNOSIS — J9811 Atelectasis: Secondary | ICD-10-CM | POA: Diagnosis not present

## 2020-10-10 MED ORDER — IOPAMIDOL (ISOVUE-300) INJECTION 61%
100.0000 mL | Freq: Once | INTRAVENOUS | Status: AC | PRN
  Administered 2020-10-10: 100 mL via INTRAVENOUS

## 2020-10-28 DIAGNOSIS — K219 Gastro-esophageal reflux disease without esophagitis: Secondary | ICD-10-CM | POA: Diagnosis not present

## 2020-10-28 DIAGNOSIS — Z1159 Encounter for screening for other viral diseases: Secondary | ICD-10-CM | POA: Diagnosis not present

## 2020-10-28 DIAGNOSIS — I10 Essential (primary) hypertension: Secondary | ICD-10-CM | POA: Diagnosis not present

## 2020-10-28 DIAGNOSIS — E782 Mixed hyperlipidemia: Secondary | ICD-10-CM | POA: Diagnosis not present

## 2020-10-28 DIAGNOSIS — N182 Chronic kidney disease, stage 2 (mild): Secondary | ICD-10-CM | POA: Diagnosis not present

## 2020-10-28 DIAGNOSIS — E039 Hypothyroidism, unspecified: Secondary | ICD-10-CM | POA: Diagnosis not present

## 2020-10-28 DIAGNOSIS — E7849 Other hyperlipidemia: Secondary | ICD-10-CM | POA: Diagnosis not present

## 2020-10-31 DIAGNOSIS — Z23 Encounter for immunization: Secondary | ICD-10-CM | POA: Diagnosis not present

## 2020-10-31 DIAGNOSIS — I10 Essential (primary) hypertension: Secondary | ICD-10-CM | POA: Diagnosis not present

## 2020-10-31 DIAGNOSIS — M3390 Dermatopolymyositis, unspecified, organ involvement unspecified: Secondary | ICD-10-CM | POA: Diagnosis not present

## 2020-10-31 DIAGNOSIS — F339 Major depressive disorder, recurrent, unspecified: Secondary | ICD-10-CM | POA: Diagnosis not present

## 2020-10-31 DIAGNOSIS — M6282 Rhabdomyolysis: Secondary | ICD-10-CM | POA: Diagnosis not present

## 2020-10-31 DIAGNOSIS — N183 Chronic kidney disease, stage 3 unspecified: Secondary | ICD-10-CM | POA: Diagnosis not present

## 2020-10-31 DIAGNOSIS — E039 Hypothyroidism, unspecified: Secondary | ICD-10-CM | POA: Diagnosis not present

## 2020-10-31 DIAGNOSIS — M329 Systemic lupus erythematosus, unspecified: Secondary | ICD-10-CM | POA: Diagnosis not present

## 2020-11-04 ENCOUNTER — Other Ambulatory Visit: Payer: Self-pay | Admitting: Family Medicine

## 2020-11-04 DIAGNOSIS — R748 Abnormal levels of other serum enzymes: Secondary | ICD-10-CM | POA: Diagnosis not present

## 2020-11-04 DIAGNOSIS — I7300 Raynaud's syndrome without gangrene: Secondary | ICD-10-CM | POA: Diagnosis not present

## 2020-11-04 DIAGNOSIS — R5383 Other fatigue: Secondary | ICD-10-CM | POA: Diagnosis not present

## 2020-11-04 DIAGNOSIS — R21 Rash and other nonspecific skin eruption: Secondary | ICD-10-CM | POA: Diagnosis not present

## 2020-11-04 DIAGNOSIS — M329 Systemic lupus erythematosus, unspecified: Secondary | ICD-10-CM | POA: Diagnosis not present

## 2020-11-04 DIAGNOSIS — Z1231 Encounter for screening mammogram for malignant neoplasm of breast: Secondary | ICD-10-CM

## 2020-11-04 DIAGNOSIS — Z13820 Encounter for screening for osteoporosis: Secondary | ICD-10-CM | POA: Diagnosis not present

## 2020-11-04 DIAGNOSIS — M810 Age-related osteoporosis without current pathological fracture: Secondary | ICD-10-CM | POA: Diagnosis not present

## 2020-11-04 DIAGNOSIS — M1990 Unspecified osteoarthritis, unspecified site: Secondary | ICD-10-CM | POA: Diagnosis not present

## 2020-11-04 DIAGNOSIS — Z79899 Other long term (current) drug therapy: Secondary | ICD-10-CM | POA: Diagnosis not present

## 2020-11-04 DIAGNOSIS — M609 Myositis, unspecified: Secondary | ICD-10-CM | POA: Diagnosis not present

## 2020-11-05 ENCOUNTER — Ambulatory Visit: Payer: PPO

## 2020-12-05 DIAGNOSIS — Z6826 Body mass index (BMI) 26.0-26.9, adult: Secondary | ICD-10-CM | POA: Diagnosis not present

## 2020-12-05 DIAGNOSIS — E7849 Other hyperlipidemia: Secondary | ICD-10-CM | POA: Diagnosis not present

## 2020-12-05 DIAGNOSIS — R0789 Other chest pain: Secondary | ICD-10-CM | POA: Diagnosis not present

## 2020-12-05 DIAGNOSIS — M329 Systemic lupus erythematosus, unspecified: Secondary | ICD-10-CM | POA: Diagnosis not present

## 2020-12-05 DIAGNOSIS — K219 Gastro-esophageal reflux disease without esophagitis: Secondary | ICD-10-CM | POA: Diagnosis not present

## 2020-12-05 DIAGNOSIS — I10 Essential (primary) hypertension: Secondary | ICD-10-CM | POA: Diagnosis not present

## 2020-12-05 DIAGNOSIS — M3390 Dermatopolymyositis, unspecified, organ involvement unspecified: Secondary | ICD-10-CM | POA: Diagnosis not present

## 2020-12-17 DIAGNOSIS — R0789 Other chest pain: Secondary | ICD-10-CM | POA: Diagnosis not present

## 2020-12-17 DIAGNOSIS — R110 Nausea: Secondary | ICD-10-CM | POA: Diagnosis not present

## 2020-12-17 DIAGNOSIS — R079 Chest pain, unspecified: Secondary | ICD-10-CM | POA: Diagnosis not present

## 2020-12-30 DIAGNOSIS — M329 Systemic lupus erythematosus, unspecified: Secondary | ICD-10-CM | POA: Diagnosis not present

## 2020-12-30 DIAGNOSIS — R5383 Other fatigue: Secondary | ICD-10-CM | POA: Diagnosis not present

## 2020-12-30 DIAGNOSIS — L659 Nonscarring hair loss, unspecified: Secondary | ICD-10-CM | POA: Diagnosis not present

## 2020-12-30 DIAGNOSIS — R21 Rash and other nonspecific skin eruption: Secondary | ICD-10-CM | POA: Diagnosis not present

## 2020-12-30 DIAGNOSIS — M1990 Unspecified osteoarthritis, unspecified site: Secondary | ICD-10-CM | POA: Diagnosis not present

## 2020-12-30 DIAGNOSIS — Z13820 Encounter for screening for osteoporosis: Secondary | ICD-10-CM | POA: Diagnosis not present

## 2020-12-30 DIAGNOSIS — I7300 Raynaud's syndrome without gangrene: Secondary | ICD-10-CM | POA: Diagnosis not present

## 2020-12-30 DIAGNOSIS — M609 Myositis, unspecified: Secondary | ICD-10-CM | POA: Diagnosis not present

## 2020-12-30 DIAGNOSIS — Z79899 Other long term (current) drug therapy: Secondary | ICD-10-CM | POA: Diagnosis not present

## 2020-12-30 DIAGNOSIS — M810 Age-related osteoporosis without current pathological fracture: Secondary | ICD-10-CM | POA: Diagnosis not present

## 2020-12-30 DIAGNOSIS — R748 Abnormal levels of other serum enzymes: Secondary | ICD-10-CM | POA: Diagnosis not present

## 2021-01-11 DIAGNOSIS — Z20828 Contact with and (suspected) exposure to other viral communicable diseases: Secondary | ICD-10-CM | POA: Diagnosis not present

## 2021-01-11 DIAGNOSIS — J101 Influenza due to other identified influenza virus with other respiratory manifestations: Secondary | ICD-10-CM | POA: Diagnosis not present

## 2021-01-11 DIAGNOSIS — R059 Cough, unspecified: Secondary | ICD-10-CM | POA: Diagnosis not present

## 2021-03-12 DIAGNOSIS — M609 Myositis, unspecified: Secondary | ICD-10-CM | POA: Diagnosis not present

## 2021-03-12 DIAGNOSIS — R21 Rash and other nonspecific skin eruption: Secondary | ICD-10-CM | POA: Diagnosis not present

## 2021-03-12 DIAGNOSIS — M329 Systemic lupus erythematosus, unspecified: Secondary | ICD-10-CM | POA: Diagnosis not present

## 2021-03-12 DIAGNOSIS — L659 Nonscarring hair loss, unspecified: Secondary | ICD-10-CM | POA: Diagnosis not present

## 2021-03-12 DIAGNOSIS — Z79899 Other long term (current) drug therapy: Secondary | ICD-10-CM | POA: Diagnosis not present

## 2021-03-12 DIAGNOSIS — I7300 Raynaud's syndrome without gangrene: Secondary | ICD-10-CM | POA: Diagnosis not present

## 2021-03-12 DIAGNOSIS — R5383 Other fatigue: Secondary | ICD-10-CM | POA: Diagnosis not present

## 2021-03-12 DIAGNOSIS — M1990 Unspecified osteoarthritis, unspecified site: Secondary | ICD-10-CM | POA: Diagnosis not present

## 2021-03-12 DIAGNOSIS — M810 Age-related osteoporosis without current pathological fracture: Secondary | ICD-10-CM | POA: Diagnosis not present

## 2021-03-12 DIAGNOSIS — R748 Abnormal levels of other serum enzymes: Secondary | ICD-10-CM | POA: Diagnosis not present

## 2021-04-17 DIAGNOSIS — Z1231 Encounter for screening mammogram for malignant neoplasm of breast: Secondary | ICD-10-CM | POA: Diagnosis not present

## 2021-04-21 DIAGNOSIS — Z8041 Family history of malignant neoplasm of ovary: Secondary | ICD-10-CM | POA: Diagnosis not present

## 2021-04-21 DIAGNOSIS — E039 Hypothyroidism, unspecified: Secondary | ICD-10-CM | POA: Diagnosis not present

## 2021-04-21 DIAGNOSIS — H34832 Tributary (branch) retinal vein occlusion, left eye, with macular edema: Secondary | ICD-10-CM | POA: Diagnosis not present

## 2021-04-21 DIAGNOSIS — I10 Essential (primary) hypertension: Secondary | ICD-10-CM | POA: Diagnosis not present

## 2021-04-21 DIAGNOSIS — E782 Mixed hyperlipidemia: Secondary | ICD-10-CM | POA: Diagnosis not present

## 2021-04-21 DIAGNOSIS — N182 Chronic kidney disease, stage 2 (mild): Secondary | ICD-10-CM | POA: Diagnosis not present

## 2021-04-21 DIAGNOSIS — E7849 Other hyperlipidemia: Secondary | ICD-10-CM | POA: Diagnosis not present

## 2021-04-22 ENCOUNTER — Other Ambulatory Visit: Payer: Self-pay

## 2021-04-22 ENCOUNTER — Encounter (INDEPENDENT_AMBULATORY_CARE_PROVIDER_SITE_OTHER): Payer: PPO | Admitting: Ophthalmology

## 2021-04-22 DIAGNOSIS — H34832 Tributary (branch) retinal vein occlusion, left eye, with macular edema: Secondary | ICD-10-CM

## 2021-04-22 DIAGNOSIS — H43813 Vitreous degeneration, bilateral: Secondary | ICD-10-CM

## 2021-04-22 DIAGNOSIS — H35033 Hypertensive retinopathy, bilateral: Secondary | ICD-10-CM | POA: Diagnosis not present

## 2021-04-22 DIAGNOSIS — H33303 Unspecified retinal break, bilateral: Secondary | ICD-10-CM

## 2021-04-22 DIAGNOSIS — I10 Essential (primary) hypertension: Secondary | ICD-10-CM

## 2021-04-29 DIAGNOSIS — I10 Essential (primary) hypertension: Secondary | ICD-10-CM | POA: Diagnosis not present

## 2021-04-29 DIAGNOSIS — E039 Hypothyroidism, unspecified: Secondary | ICD-10-CM | POA: Diagnosis not present

## 2021-04-29 DIAGNOSIS — F339 Major depressive disorder, recurrent, unspecified: Secondary | ICD-10-CM | POA: Diagnosis not present

## 2021-04-29 DIAGNOSIS — N182 Chronic kidney disease, stage 2 (mild): Secondary | ICD-10-CM | POA: Diagnosis not present

## 2021-04-29 DIAGNOSIS — M329 Systemic lupus erythematosus, unspecified: Secondary | ICD-10-CM | POA: Diagnosis not present

## 2021-04-29 DIAGNOSIS — E7849 Other hyperlipidemia: Secondary | ICD-10-CM | POA: Diagnosis not present

## 2021-04-29 DIAGNOSIS — Z6825 Body mass index (BMI) 25.0-25.9, adult: Secondary | ICD-10-CM | POA: Diagnosis not present

## 2021-04-29 DIAGNOSIS — Z0001 Encounter for general adult medical examination with abnormal findings: Secondary | ICD-10-CM | POA: Diagnosis not present

## 2021-04-30 DIAGNOSIS — Z0001 Encounter for general adult medical examination with abnormal findings: Secondary | ICD-10-CM | POA: Diagnosis not present

## 2021-04-30 DIAGNOSIS — M329 Systemic lupus erythematosus, unspecified: Secondary | ICD-10-CM | POA: Diagnosis not present

## 2021-04-30 DIAGNOSIS — H3562 Retinal hemorrhage, left eye: Secondary | ICD-10-CM | POA: Diagnosis not present

## 2021-04-30 DIAGNOSIS — Z6825 Body mass index (BMI) 25.0-25.9, adult: Secondary | ICD-10-CM | POA: Diagnosis not present

## 2021-04-30 DIAGNOSIS — F339 Major depressive disorder, recurrent, unspecified: Secondary | ICD-10-CM | POA: Diagnosis not present

## 2021-04-30 DIAGNOSIS — M3390 Dermatopolymyositis, unspecified, organ involvement unspecified: Secondary | ICD-10-CM | POA: Diagnosis not present

## 2021-04-30 DIAGNOSIS — E7849 Other hyperlipidemia: Secondary | ICD-10-CM | POA: Diagnosis not present

## 2021-04-30 DIAGNOSIS — I10 Essential (primary) hypertension: Secondary | ICD-10-CM | POA: Diagnosis not present

## 2021-05-29 ENCOUNTER — Encounter (INDEPENDENT_AMBULATORY_CARE_PROVIDER_SITE_OTHER): Payer: PPO | Admitting: Ophthalmology

## 2021-05-29 DIAGNOSIS — H43813 Vitreous degeneration, bilateral: Secondary | ICD-10-CM

## 2021-05-29 DIAGNOSIS — H35033 Hypertensive retinopathy, bilateral: Secondary | ICD-10-CM

## 2021-05-29 DIAGNOSIS — I10 Essential (primary) hypertension: Secondary | ICD-10-CM

## 2021-05-29 DIAGNOSIS — H34832 Tributary (branch) retinal vein occlusion, left eye, with macular edema: Secondary | ICD-10-CM | POA: Diagnosis not present

## 2021-06-09 DIAGNOSIS — M609 Myositis, unspecified: Secondary | ICD-10-CM | POA: Diagnosis not present

## 2021-06-09 DIAGNOSIS — M1990 Unspecified osteoarthritis, unspecified site: Secondary | ICD-10-CM | POA: Diagnosis not present

## 2021-06-09 DIAGNOSIS — L659 Nonscarring hair loss, unspecified: Secondary | ICD-10-CM | POA: Diagnosis not present

## 2021-06-09 DIAGNOSIS — M329 Systemic lupus erythematosus, unspecified: Secondary | ICD-10-CM | POA: Diagnosis not present

## 2021-06-09 DIAGNOSIS — M810 Age-related osteoporosis without current pathological fracture: Secondary | ICD-10-CM | POA: Diagnosis not present

## 2021-06-09 DIAGNOSIS — R21 Rash and other nonspecific skin eruption: Secondary | ICD-10-CM | POA: Diagnosis not present

## 2021-06-09 DIAGNOSIS — I7300 Raynaud's syndrome without gangrene: Secondary | ICD-10-CM | POA: Diagnosis not present

## 2021-06-09 DIAGNOSIS — Z79899 Other long term (current) drug therapy: Secondary | ICD-10-CM | POA: Diagnosis not present

## 2021-06-09 DIAGNOSIS — R5383 Other fatigue: Secondary | ICD-10-CM | POA: Diagnosis not present

## 2021-06-09 DIAGNOSIS — R748 Abnormal levels of other serum enzymes: Secondary | ICD-10-CM | POA: Diagnosis not present

## 2021-06-26 ENCOUNTER — Encounter (INDEPENDENT_AMBULATORY_CARE_PROVIDER_SITE_OTHER): Payer: PPO | Admitting: Ophthalmology

## 2021-06-26 DIAGNOSIS — H35033 Hypertensive retinopathy, bilateral: Secondary | ICD-10-CM | POA: Diagnosis not present

## 2021-06-26 DIAGNOSIS — I10 Essential (primary) hypertension: Secondary | ICD-10-CM

## 2021-06-26 DIAGNOSIS — H33303 Unspecified retinal break, bilateral: Secondary | ICD-10-CM

## 2021-06-26 DIAGNOSIS — H34832 Tributary (branch) retinal vein occlusion, left eye, with macular edema: Secondary | ICD-10-CM

## 2021-06-26 DIAGNOSIS — H43813 Vitreous degeneration, bilateral: Secondary | ICD-10-CM

## 2021-07-24 ENCOUNTER — Encounter (INDEPENDENT_AMBULATORY_CARE_PROVIDER_SITE_OTHER): Payer: PPO | Admitting: Ophthalmology

## 2021-07-24 DIAGNOSIS — H43813 Vitreous degeneration, bilateral: Secondary | ICD-10-CM

## 2021-07-24 DIAGNOSIS — H35033 Hypertensive retinopathy, bilateral: Secondary | ICD-10-CM

## 2021-07-24 DIAGNOSIS — H33303 Unspecified retinal break, bilateral: Secondary | ICD-10-CM

## 2021-07-24 DIAGNOSIS — H34832 Tributary (branch) retinal vein occlusion, left eye, with macular edema: Secondary | ICD-10-CM | POA: Diagnosis not present

## 2021-07-24 DIAGNOSIS — I10 Essential (primary) hypertension: Secondary | ICD-10-CM | POA: Diagnosis not present

## 2021-07-31 DIAGNOSIS — M7062 Trochanteric bursitis, left hip: Secondary | ICD-10-CM | POA: Diagnosis not present

## 2021-07-31 DIAGNOSIS — M329 Systemic lupus erythematosus, unspecified: Secondary | ICD-10-CM | POA: Diagnosis not present

## 2021-07-31 DIAGNOSIS — Z6825 Body mass index (BMI) 25.0-25.9, adult: Secondary | ICD-10-CM | POA: Diagnosis not present

## 2021-08-20 ENCOUNTER — Encounter (INDEPENDENT_AMBULATORY_CARE_PROVIDER_SITE_OTHER): Payer: PPO | Admitting: Ophthalmology

## 2021-08-20 DIAGNOSIS — H35033 Hypertensive retinopathy, bilateral: Secondary | ICD-10-CM | POA: Diagnosis not present

## 2021-08-20 DIAGNOSIS — H33303 Unspecified retinal break, bilateral: Secondary | ICD-10-CM | POA: Diagnosis not present

## 2021-08-20 DIAGNOSIS — I10 Essential (primary) hypertension: Secondary | ICD-10-CM | POA: Diagnosis not present

## 2021-08-20 DIAGNOSIS — H43813 Vitreous degeneration, bilateral: Secondary | ICD-10-CM

## 2021-08-20 DIAGNOSIS — H34832 Tributary (branch) retinal vein occlusion, left eye, with macular edema: Secondary | ICD-10-CM

## 2021-09-15 DIAGNOSIS — Z79899 Other long term (current) drug therapy: Secondary | ICD-10-CM | POA: Diagnosis not present

## 2021-09-15 DIAGNOSIS — M1990 Unspecified osteoarthritis, unspecified site: Secondary | ICD-10-CM | POA: Diagnosis not present

## 2021-09-15 DIAGNOSIS — R748 Abnormal levels of other serum enzymes: Secondary | ICD-10-CM | POA: Diagnosis not present

## 2021-09-15 DIAGNOSIS — R21 Rash and other nonspecific skin eruption: Secondary | ICD-10-CM | POA: Diagnosis not present

## 2021-09-15 DIAGNOSIS — M810 Age-related osteoporosis without current pathological fracture: Secondary | ICD-10-CM | POA: Diagnosis not present

## 2021-09-15 DIAGNOSIS — I7300 Raynaud's syndrome without gangrene: Secondary | ICD-10-CM | POA: Diagnosis not present

## 2021-09-15 DIAGNOSIS — M609 Myositis, unspecified: Secondary | ICD-10-CM | POA: Diagnosis not present

## 2021-09-15 DIAGNOSIS — M329 Systemic lupus erythematosus, unspecified: Secondary | ICD-10-CM | POA: Diagnosis not present

## 2021-09-15 DIAGNOSIS — M3390 Dermatopolymyositis, unspecified, organ involvement unspecified: Secondary | ICD-10-CM | POA: Diagnosis not present

## 2021-09-17 ENCOUNTER — Encounter (INDEPENDENT_AMBULATORY_CARE_PROVIDER_SITE_OTHER): Payer: PPO | Admitting: Ophthalmology

## 2021-09-17 DIAGNOSIS — H43813 Vitreous degeneration, bilateral: Secondary | ICD-10-CM | POA: Diagnosis not present

## 2021-09-17 DIAGNOSIS — I10 Essential (primary) hypertension: Secondary | ICD-10-CM | POA: Diagnosis not present

## 2021-09-17 DIAGNOSIS — H35033 Hypertensive retinopathy, bilateral: Secondary | ICD-10-CM | POA: Diagnosis not present

## 2021-09-17 DIAGNOSIS — H33303 Unspecified retinal break, bilateral: Secondary | ICD-10-CM | POA: Diagnosis not present

## 2021-09-17 DIAGNOSIS — H34832 Tributary (branch) retinal vein occlusion, left eye, with macular edema: Secondary | ICD-10-CM

## 2021-09-18 DIAGNOSIS — L820 Inflamed seborrheic keratosis: Secondary | ICD-10-CM | POA: Diagnosis not present

## 2021-09-18 DIAGNOSIS — B078 Other viral warts: Secondary | ICD-10-CM | POA: Diagnosis not present

## 2021-10-15 ENCOUNTER — Encounter (INDEPENDENT_AMBULATORY_CARE_PROVIDER_SITE_OTHER): Payer: PPO | Admitting: Ophthalmology

## 2021-10-15 DIAGNOSIS — H43813 Vitreous degeneration, bilateral: Secondary | ICD-10-CM

## 2021-10-15 DIAGNOSIS — H33303 Unspecified retinal break, bilateral: Secondary | ICD-10-CM

## 2021-10-15 DIAGNOSIS — H34832 Tributary (branch) retinal vein occlusion, left eye, with macular edema: Secondary | ICD-10-CM

## 2021-10-15 DIAGNOSIS — H35033 Hypertensive retinopathy, bilateral: Secondary | ICD-10-CM | POA: Diagnosis not present

## 2021-10-15 DIAGNOSIS — I10 Essential (primary) hypertension: Secondary | ICD-10-CM | POA: Diagnosis not present

## 2021-10-29 DIAGNOSIS — E7849 Other hyperlipidemia: Secondary | ICD-10-CM | POA: Diagnosis not present

## 2021-10-29 DIAGNOSIS — M329 Systemic lupus erythematosus, unspecified: Secondary | ICD-10-CM | POA: Diagnosis not present

## 2021-10-29 DIAGNOSIS — E039 Hypothyroidism, unspecified: Secondary | ICD-10-CM | POA: Diagnosis not present

## 2021-10-29 DIAGNOSIS — N183 Chronic kidney disease, stage 3 unspecified: Secondary | ICD-10-CM | POA: Diagnosis not present

## 2021-11-03 DIAGNOSIS — F339 Major depressive disorder, recurrent, unspecified: Secondary | ICD-10-CM | POA: Diagnosis not present

## 2021-11-03 DIAGNOSIS — Z6825 Body mass index (BMI) 25.0-25.9, adult: Secondary | ICD-10-CM | POA: Diagnosis not present

## 2021-11-03 DIAGNOSIS — K219 Gastro-esophageal reflux disease without esophagitis: Secondary | ICD-10-CM | POA: Diagnosis not present

## 2021-11-03 DIAGNOSIS — M3390 Dermatopolymyositis, unspecified, organ involvement unspecified: Secondary | ICD-10-CM | POA: Diagnosis not present

## 2021-11-03 DIAGNOSIS — E039 Hypothyroidism, unspecified: Secondary | ICD-10-CM | POA: Diagnosis not present

## 2021-11-03 DIAGNOSIS — Z23 Encounter for immunization: Secondary | ICD-10-CM | POA: Diagnosis not present

## 2021-11-03 DIAGNOSIS — E7849 Other hyperlipidemia: Secondary | ICD-10-CM | POA: Diagnosis not present

## 2021-11-03 DIAGNOSIS — I10 Essential (primary) hypertension: Secondary | ICD-10-CM | POA: Diagnosis not present

## 2021-11-03 DIAGNOSIS — M329 Systemic lupus erythematosus, unspecified: Secondary | ICD-10-CM | POA: Diagnosis not present

## 2021-11-08 DIAGNOSIS — E039 Hypothyroidism, unspecified: Secondary | ICD-10-CM | POA: Diagnosis not present

## 2021-11-08 DIAGNOSIS — I10 Essential (primary) hypertension: Secondary | ICD-10-CM | POA: Diagnosis not present

## 2021-11-08 DIAGNOSIS — E782 Mixed hyperlipidemia: Secondary | ICD-10-CM | POA: Diagnosis not present

## 2021-11-08 DIAGNOSIS — N183 Chronic kidney disease, stage 3 unspecified: Secondary | ICD-10-CM | POA: Diagnosis not present

## 2021-11-19 ENCOUNTER — Encounter (INDEPENDENT_AMBULATORY_CARE_PROVIDER_SITE_OTHER): Payer: PPO | Admitting: Ophthalmology

## 2021-11-19 DIAGNOSIS — I10 Essential (primary) hypertension: Secondary | ICD-10-CM

## 2021-11-19 DIAGNOSIS — H35033 Hypertensive retinopathy, bilateral: Secondary | ICD-10-CM | POA: Diagnosis not present

## 2021-11-19 DIAGNOSIS — H43813 Vitreous degeneration, bilateral: Secondary | ICD-10-CM

## 2021-11-19 DIAGNOSIS — H34832 Tributary (branch) retinal vein occlusion, left eye, with macular edema: Secondary | ICD-10-CM

## 2021-11-19 DIAGNOSIS — H33303 Unspecified retinal break, bilateral: Secondary | ICD-10-CM | POA: Diagnosis not present

## 2021-12-16 DIAGNOSIS — Z79899 Other long term (current) drug therapy: Secondary | ICD-10-CM | POA: Diagnosis not present

## 2021-12-16 DIAGNOSIS — M609 Myositis, unspecified: Secondary | ICD-10-CM | POA: Diagnosis not present

## 2021-12-16 DIAGNOSIS — I7300 Raynaud's syndrome without gangrene: Secondary | ICD-10-CM | POA: Diagnosis not present

## 2021-12-16 DIAGNOSIS — R748 Abnormal levels of other serum enzymes: Secondary | ICD-10-CM | POA: Diagnosis not present

## 2021-12-16 DIAGNOSIS — R21 Rash and other nonspecific skin eruption: Secondary | ICD-10-CM | POA: Diagnosis not present

## 2021-12-16 DIAGNOSIS — M3390 Dermatopolymyositis, unspecified, organ involvement unspecified: Secondary | ICD-10-CM | POA: Diagnosis not present

## 2021-12-16 DIAGNOSIS — M329 Systemic lupus erythematosus, unspecified: Secondary | ICD-10-CM | POA: Diagnosis not present

## 2021-12-16 DIAGNOSIS — M1990 Unspecified osteoarthritis, unspecified site: Secondary | ICD-10-CM | POA: Diagnosis not present

## 2021-12-16 DIAGNOSIS — M810 Age-related osteoporosis without current pathological fracture: Secondary | ICD-10-CM | POA: Diagnosis not present

## 2021-12-24 ENCOUNTER — Encounter (INDEPENDENT_AMBULATORY_CARE_PROVIDER_SITE_OTHER): Payer: PPO | Admitting: Ophthalmology

## 2021-12-24 DIAGNOSIS — I10 Essential (primary) hypertension: Secondary | ICD-10-CM

## 2021-12-24 DIAGNOSIS — H34832 Tributary (branch) retinal vein occlusion, left eye, with macular edema: Secondary | ICD-10-CM | POA: Diagnosis not present

## 2021-12-24 DIAGNOSIS — H43813 Vitreous degeneration, bilateral: Secondary | ICD-10-CM

## 2021-12-24 DIAGNOSIS — H35033 Hypertensive retinopathy, bilateral: Secondary | ICD-10-CM

## 2021-12-24 DIAGNOSIS — H33303 Unspecified retinal break, bilateral: Secondary | ICD-10-CM

## 2021-12-30 DIAGNOSIS — E039 Hypothyroidism, unspecified: Secondary | ICD-10-CM | POA: Diagnosis not present

## 2022-01-15 DIAGNOSIS — M25552 Pain in left hip: Secondary | ICD-10-CM | POA: Diagnosis not present

## 2022-01-19 DIAGNOSIS — R030 Elevated blood-pressure reading, without diagnosis of hypertension: Secondary | ICD-10-CM | POA: Diagnosis not present

## 2022-01-19 DIAGNOSIS — Z6825 Body mass index (BMI) 25.0-25.9, adult: Secondary | ICD-10-CM | POA: Diagnosis not present

## 2022-01-19 DIAGNOSIS — H6593 Unspecified nonsuppurative otitis media, bilateral: Secondary | ICD-10-CM | POA: Diagnosis not present

## 2022-01-21 DIAGNOSIS — M25552 Pain in left hip: Secondary | ICD-10-CM | POA: Diagnosis not present

## 2022-01-28 ENCOUNTER — Encounter (INDEPENDENT_AMBULATORY_CARE_PROVIDER_SITE_OTHER): Payer: PPO | Admitting: Ophthalmology

## 2022-01-28 DIAGNOSIS — I10 Essential (primary) hypertension: Secondary | ICD-10-CM | POA: Diagnosis not present

## 2022-01-28 DIAGNOSIS — H43813 Vitreous degeneration, bilateral: Secondary | ICD-10-CM

## 2022-01-28 DIAGNOSIS — H35033 Hypertensive retinopathy, bilateral: Secondary | ICD-10-CM | POA: Diagnosis not present

## 2022-01-28 DIAGNOSIS — H34832 Tributary (branch) retinal vein occlusion, left eye, with macular edema: Secondary | ICD-10-CM

## 2022-01-28 DIAGNOSIS — H33303 Unspecified retinal break, bilateral: Secondary | ICD-10-CM | POA: Diagnosis not present

## 2022-02-08 DIAGNOSIS — E782 Mixed hyperlipidemia: Secondary | ICD-10-CM | POA: Diagnosis not present

## 2022-02-08 DIAGNOSIS — E039 Hypothyroidism, unspecified: Secondary | ICD-10-CM | POA: Diagnosis not present

## 2022-02-08 DIAGNOSIS — N183 Chronic kidney disease, stage 3 unspecified: Secondary | ICD-10-CM | POA: Diagnosis not present

## 2022-02-08 DIAGNOSIS — I10 Essential (primary) hypertension: Secondary | ICD-10-CM | POA: Diagnosis not present

## 2022-02-19 DIAGNOSIS — M7062 Trochanteric bursitis, left hip: Secondary | ICD-10-CM | POA: Diagnosis not present

## 2022-03-04 ENCOUNTER — Encounter (INDEPENDENT_AMBULATORY_CARE_PROVIDER_SITE_OTHER): Payer: PPO | Admitting: Ophthalmology

## 2022-03-04 DIAGNOSIS — I10 Essential (primary) hypertension: Secondary | ICD-10-CM

## 2022-03-04 DIAGNOSIS — H35033 Hypertensive retinopathy, bilateral: Secondary | ICD-10-CM

## 2022-03-04 DIAGNOSIS — H43813 Vitreous degeneration, bilateral: Secondary | ICD-10-CM | POA: Diagnosis not present

## 2022-03-04 DIAGNOSIS — H33303 Unspecified retinal break, bilateral: Secondary | ICD-10-CM

## 2022-03-04 DIAGNOSIS — H34832 Tributary (branch) retinal vein occlusion, left eye, with macular edema: Secondary | ICD-10-CM | POA: Diagnosis not present

## 2022-03-18 ENCOUNTER — Other Ambulatory Visit: Payer: Self-pay | Admitting: Rheumatology

## 2022-03-18 DIAGNOSIS — M3390 Dermatopolymyositis, unspecified, organ involvement unspecified: Secondary | ICD-10-CM | POA: Diagnosis not present

## 2022-03-18 DIAGNOSIS — I7300 Raynaud's syndrome without gangrene: Secondary | ICD-10-CM | POA: Diagnosis not present

## 2022-03-18 DIAGNOSIS — M1990 Unspecified osteoarthritis, unspecified site: Secondary | ICD-10-CM | POA: Diagnosis not present

## 2022-03-18 DIAGNOSIS — R21 Rash and other nonspecific skin eruption: Secondary | ICD-10-CM | POA: Diagnosis not present

## 2022-03-18 DIAGNOSIS — M810 Age-related osteoporosis without current pathological fracture: Secondary | ICD-10-CM | POA: Diagnosis not present

## 2022-03-18 DIAGNOSIS — Z79899 Other long term (current) drug therapy: Secondary | ICD-10-CM | POA: Diagnosis not present

## 2022-03-18 DIAGNOSIS — R748 Abnormal levels of other serum enzymes: Secondary | ICD-10-CM | POA: Diagnosis not present

## 2022-03-18 DIAGNOSIS — M329 Systemic lupus erythematosus, unspecified: Secondary | ICD-10-CM | POA: Diagnosis not present

## 2022-03-18 DIAGNOSIS — M609 Myositis, unspecified: Secondary | ICD-10-CM | POA: Diagnosis not present

## 2022-03-18 DIAGNOSIS — M3310 Other dermatopolymyositis, organ involvement unspecified: Secondary | ICD-10-CM

## 2022-04-08 ENCOUNTER — Encounter (INDEPENDENT_AMBULATORY_CARE_PROVIDER_SITE_OTHER): Payer: PPO | Admitting: Ophthalmology

## 2022-04-08 DIAGNOSIS — H35033 Hypertensive retinopathy, bilateral: Secondary | ICD-10-CM

## 2022-04-08 DIAGNOSIS — H33303 Unspecified retinal break, bilateral: Secondary | ICD-10-CM

## 2022-04-08 DIAGNOSIS — H43813 Vitreous degeneration, bilateral: Secondary | ICD-10-CM | POA: Diagnosis not present

## 2022-04-08 DIAGNOSIS — H34832 Tributary (branch) retinal vein occlusion, left eye, with macular edema: Secondary | ICD-10-CM

## 2022-04-08 DIAGNOSIS — I10 Essential (primary) hypertension: Secondary | ICD-10-CM | POA: Diagnosis not present

## 2022-04-28 DIAGNOSIS — Z1231 Encounter for screening mammogram for malignant neoplasm of breast: Secondary | ICD-10-CM | POA: Diagnosis not present

## 2022-04-29 ENCOUNTER — Ambulatory Visit
Admission: RE | Admit: 2022-04-29 | Discharge: 2022-04-29 | Disposition: A | Discharge status: Home / Self Care | Payer: PPO | Source: Ambulatory Visit | Attending: Rheumatology | Admitting: Rheumatology

## 2022-04-29 DIAGNOSIS — M3310 Other dermatopolymyositis, organ involvement unspecified: Secondary | ICD-10-CM | POA: Diagnosis not present

## 2022-04-29 MED ORDER — IOPAMIDOL (ISOVUE-300) INJECTION 61%
100.0000 mL | Freq: Once | INTRAVENOUS | Status: AC | PRN
  Administered 2022-04-29: 100 mL via INTRAVENOUS

## 2022-04-30 DIAGNOSIS — E7849 Other hyperlipidemia: Secondary | ICD-10-CM | POA: Diagnosis not present

## 2022-04-30 DIAGNOSIS — N183 Chronic kidney disease, stage 3 unspecified: Secondary | ICD-10-CM | POA: Diagnosis not present

## 2022-04-30 DIAGNOSIS — E559 Vitamin D deficiency, unspecified: Secondary | ICD-10-CM | POA: Diagnosis not present

## 2022-04-30 DIAGNOSIS — E039 Hypothyroidism, unspecified: Secondary | ICD-10-CM | POA: Diagnosis not present

## 2022-04-30 DIAGNOSIS — D519 Vitamin B12 deficiency anemia, unspecified: Secondary | ICD-10-CM | POA: Diagnosis not present

## 2022-04-30 DIAGNOSIS — Z0001 Encounter for general adult medical examination with abnormal findings: Secondary | ICD-10-CM | POA: Diagnosis not present

## 2022-05-11 DIAGNOSIS — M6281 Muscle weakness (generalized): Secondary | ICD-10-CM | POA: Diagnosis not present

## 2022-05-11 DIAGNOSIS — M25552 Pain in left hip: Secondary | ICD-10-CM | POA: Diagnosis not present

## 2022-05-13 ENCOUNTER — Encounter (INDEPENDENT_AMBULATORY_CARE_PROVIDER_SITE_OTHER): Payer: PPO | Admitting: Ophthalmology

## 2022-05-13 DIAGNOSIS — H33303 Unspecified retinal break, bilateral: Secondary | ICD-10-CM | POA: Diagnosis not present

## 2022-05-13 DIAGNOSIS — I10 Essential (primary) hypertension: Secondary | ICD-10-CM | POA: Diagnosis not present

## 2022-05-13 DIAGNOSIS — H34832 Tributary (branch) retinal vein occlusion, left eye, with macular edema: Secondary | ICD-10-CM | POA: Diagnosis not present

## 2022-05-13 DIAGNOSIS — H43813 Vitreous degeneration, bilateral: Secondary | ICD-10-CM | POA: Diagnosis not present

## 2022-05-13 DIAGNOSIS — H35033 Hypertensive retinopathy, bilateral: Secondary | ICD-10-CM | POA: Diagnosis not present

## 2022-05-15 DIAGNOSIS — E876 Hypokalemia: Secondary | ICD-10-CM | POA: Diagnosis not present

## 2022-05-15 DIAGNOSIS — E7849 Other hyperlipidemia: Secondary | ICD-10-CM | POA: Diagnosis not present

## 2022-05-15 DIAGNOSIS — M6281 Muscle weakness (generalized): Secondary | ICD-10-CM | POA: Diagnosis not present

## 2022-05-15 DIAGNOSIS — M329 Systemic lupus erythematosus, unspecified: Secondary | ICD-10-CM | POA: Diagnosis not present

## 2022-05-15 DIAGNOSIS — F339 Major depressive disorder, recurrent, unspecified: Secondary | ICD-10-CM | POA: Diagnosis not present

## 2022-05-15 DIAGNOSIS — M25552 Pain in left hip: Secondary | ICD-10-CM | POA: Diagnosis not present

## 2022-05-15 DIAGNOSIS — Z0001 Encounter for general adult medical examination with abnormal findings: Secondary | ICD-10-CM | POA: Diagnosis not present

## 2022-05-15 DIAGNOSIS — Z6825 Body mass index (BMI) 25.0-25.9, adult: Secondary | ICD-10-CM | POA: Diagnosis not present

## 2022-05-15 DIAGNOSIS — I10 Essential (primary) hypertension: Secondary | ICD-10-CM | POA: Diagnosis not present

## 2022-05-15 DIAGNOSIS — N1831 Chronic kidney disease, stage 3a: Secondary | ICD-10-CM | POA: Diagnosis not present

## 2022-05-15 DIAGNOSIS — K219 Gastro-esophageal reflux disease without esophagitis: Secondary | ICD-10-CM | POA: Diagnosis not present

## 2022-05-15 DIAGNOSIS — E039 Hypothyroidism, unspecified: Secondary | ICD-10-CM | POA: Diagnosis not present

## 2022-05-15 DIAGNOSIS — M3390 Dermatopolymyositis, unspecified, organ involvement unspecified: Secondary | ICD-10-CM | POA: Diagnosis not present

## 2022-05-19 DIAGNOSIS — M6281 Muscle weakness (generalized): Secondary | ICD-10-CM | POA: Diagnosis not present

## 2022-05-19 DIAGNOSIS — M25552 Pain in left hip: Secondary | ICD-10-CM | POA: Diagnosis not present

## 2022-05-21 DIAGNOSIS — M25552 Pain in left hip: Secondary | ICD-10-CM | POA: Diagnosis not present

## 2022-05-21 DIAGNOSIS — M6281 Muscle weakness (generalized): Secondary | ICD-10-CM | POA: Diagnosis not present

## 2022-05-25 DIAGNOSIS — M6281 Muscle weakness (generalized): Secondary | ICD-10-CM | POA: Diagnosis not present

## 2022-05-25 DIAGNOSIS — M25552 Pain in left hip: Secondary | ICD-10-CM | POA: Diagnosis not present

## 2022-05-27 DIAGNOSIS — M6281 Muscle weakness (generalized): Secondary | ICD-10-CM | POA: Diagnosis not present

## 2022-05-27 DIAGNOSIS — M25552 Pain in left hip: Secondary | ICD-10-CM | POA: Diagnosis not present

## 2022-06-01 DIAGNOSIS — M6281 Muscle weakness (generalized): Secondary | ICD-10-CM | POA: Diagnosis not present

## 2022-06-01 DIAGNOSIS — M25552 Pain in left hip: Secondary | ICD-10-CM | POA: Diagnosis not present

## 2022-06-11 DIAGNOSIS — M7062 Trochanteric bursitis, left hip: Secondary | ICD-10-CM | POA: Diagnosis not present

## 2022-06-16 DIAGNOSIS — R748 Abnormal levels of other serum enzymes: Secondary | ICD-10-CM | POA: Diagnosis not present

## 2022-06-16 DIAGNOSIS — M810 Age-related osteoporosis without current pathological fracture: Secondary | ICD-10-CM | POA: Diagnosis not present

## 2022-06-16 DIAGNOSIS — Z79899 Other long term (current) drug therapy: Secondary | ICD-10-CM | POA: Diagnosis not present

## 2022-06-16 DIAGNOSIS — M1990 Unspecified osteoarthritis, unspecified site: Secondary | ICD-10-CM | POA: Diagnosis not present

## 2022-06-16 DIAGNOSIS — M609 Myositis, unspecified: Secondary | ICD-10-CM | POA: Diagnosis not present

## 2022-06-16 DIAGNOSIS — M329 Systemic lupus erythematosus, unspecified: Secondary | ICD-10-CM | POA: Diagnosis not present

## 2022-06-16 DIAGNOSIS — M3390 Dermatopolymyositis, unspecified, organ involvement unspecified: Secondary | ICD-10-CM | POA: Diagnosis not present

## 2022-06-16 DIAGNOSIS — I7300 Raynaud's syndrome without gangrene: Secondary | ICD-10-CM | POA: Diagnosis not present

## 2022-06-17 ENCOUNTER — Encounter (INDEPENDENT_AMBULATORY_CARE_PROVIDER_SITE_OTHER): Payer: PPO | Admitting: Ophthalmology

## 2022-06-17 DIAGNOSIS — H35033 Hypertensive retinopathy, bilateral: Secondary | ICD-10-CM

## 2022-06-17 DIAGNOSIS — H33303 Unspecified retinal break, bilateral: Secondary | ICD-10-CM

## 2022-06-17 DIAGNOSIS — H43813 Vitreous degeneration, bilateral: Secondary | ICD-10-CM | POA: Diagnosis not present

## 2022-06-17 DIAGNOSIS — H34832 Tributary (branch) retinal vein occlusion, left eye, with macular edema: Secondary | ICD-10-CM

## 2022-06-17 DIAGNOSIS — I10 Essential (primary) hypertension: Secondary | ICD-10-CM | POA: Diagnosis not present

## 2022-06-25 NOTE — Patient Instructions (Signed)
DUE TO COVID-19 ONLY TWO VISITORS  (aged 73 and older)  ARE ALLOWED TO COME WITH YOU AND STAY IN THE WAITING ROOM ONLY DURING PRE OP AND PROCEDURE.   **NO VISITORS ARE ALLOWED IN THE SHORT STAY AREA OR RECOVERY ROOM!!**  IF YOU WILL BE ADMITTED INTO THE HOSPITAL YOU ARE ALLOWED ONLY FOUR SUPPORT PEOPLE DURING VISITATION HOURS ONLY (7 AM -8PM)   The support person(s) must pass our screening, gel in and out, and wear a mask at all times, including in the patient's room. Patients must also wear a mask when staff or their support person are in the room. Visitors Dahm BADGE MUST BE WORN VISIBLY  One adult visitor may remain with you overnight and MUST be in the room by 8 P.M.     Your procedure is scheduled on: 07/13/22   Report to [facility] Main Entrance    Report to admitting at : 11:30 AM   Call this number if you have problems the morning of surgery [phone]   Do not eat food :After Midnight.   After Midnight you may have the following liquids until : 10:30 AM DAY OF SURGERY  Water Black Coffee (sugar ok, NO MILK/CREAM OR CREAMERS)  Tea (sugar ok, NO MILK/CREAM OR CREAMERS) regular and decaf                             Plain Jell-O (NO RED)                                           Fruit ices (not with fruit pulp, NO RED)                                     Popsicles (NO RED)                                                                  Juice: apple, WHITE grape, WHITE cranberry Sports drinks like Gatorade (NO RED)               Oral Hygiene is also important to reduce your risk of infection.                                    Remember - BRUSH YOUR TEETH THE MORNING OF SURGERY WITH YOUR REGULAR TOOTHPASTE  DENTURES WILL BE REMOVED PRIOR TO SURGERY PLEASE DO NOT APPLY "Poly grip" OR ADHESIVES!!!   Do NOT smoke after Midnight   Take these medicines the morning of surgery with A SIP OF WATER: paroxetine,levothyroxine.                              You may not have  any metal on your body including hair pins, jewelry, and body piercing             Do not wear make-up, lotions, powders, perfumes/cologne, or deodorant  Do  not wear nail polish including gel and S&S, artificial/acrylic nails, or any other type of covering on natural nails including finger and toenails. If you have artificial nails, gel coating, etc. that needs to be removed by a nail salon please have this removed prior to surgery or surgery may need to be canceled/ delayed if the surgeon/ anesthesia feels like they are unable to be safely monitored.   Do not shave  48 hours prior to surgery.    Do not bring valuables to the hospital. [place].   Contacts, glasses, or bridgework may not be worn into surgery.   Bring small overnight bag day of surgery.   DO NOT [place]. PHARMACY WILL DISPENSE MEDICATIONS LISTED ON YOUR MEDICATION LIST TO YOU DURING YOUR ADMISSION [place]!    Patients discharged on the day of surgery will not be allowed to drive home.  Someone NEEDS to stay with you for the first 24 hours after anesthesia.   Special Instructions: Bring a copy of your healthcare power of attorney and living will documents         the day of surgery if you haven't scanned them before.              Please read over the following fact sheets you were given: IF YOU HAVE QUESTIONS ABOUT YOUR PRE-OP INSTRUCTIONS PLEASE CALL [phone]    [facility] - Preparing for Surgery Before surgery, you can play an important role.  Because skin is not sterile, your skin needs to be as free of germs as possible.  You can reduce the number of germs on your skin by washing with CHG (chlorahexidine gluconate) soap before surgery.  CHG is an antiseptic cleaner which kills germs and bonds with the skin to continue killing germs even after washing. Please DO NOT use if you have an allergy to CHG or antibacterial soaps.   If your skin becomes reddened/irritated stop using the CHG and inform your nurse when you arrive at Short Stay. Do not shave (including legs and underarms) for at least 48 hours prior to the first CHG shower.  You may shave your face/neck. Please follow these instructions carefully:  1.  Shower with CHG Soap the night before surgery and the  morning of Surgery.  2.  If you choose to wash your hair, wash your hair first as usual with your  normal  shampoo.  3.  After you shampoo, rinse your hair and body thoroughly to remove the  shampoo.                           4.  Use CHG as you would any other liquid soap.  You can apply chg directly  to the skin and wash                       Gently with a scrungie or clean washcloth.  5.  Apply the CHG Soap to your body ONLY FROM THE NECK DOWN.   Do not use on face/ open                           Wound or open sores. Avoid contact with eyes, ears mouth and genitals (private parts).  Wash face,  Genitals (private parts) with your normal soap.             6.  Wash thoroughly, paying special attention to the area where your surgery  will be performed.  7.  Thoroughly rinse your body with warm water from the neck down.  8.  DO NOT shower/wash with your normal soap after using and rinsing off  the CHG Soap.                9.  Pat yourself dry with a clean towel.            10.  Wear clean pajamas.            11.  Place clean sheets on your bed the night of your first shower and do not  sleep with pets. Day of Surgery : Do not apply any lotions/deodorants the morning of surgery.  Please wear clean clothes to the hospital/surgery center.  FAILURE TO FOLLOW THESE INSTRUCTIONS MAY RESULT IN THE CANCELLATION OF YOUR SURGERY PATIENT SIGNATURE_________________________________  NURSE SIGNATURE__________________________________  ________________________________________________________________________  Sheryl Hamilton  An incentive  spirometer is a tool that can help keep your lungs clear and active. This tool measures how well you are filling your lungs with each breath. Taking long deep breaths may help reverse or decrease the chance of developing breathing (pulmonary) problems (especially infection) following: A long period of time when you are unable to move or be active. BEFORE THE PROCEDURE  If the spirometer includes an indicator to show your best effort, your nurse or respiratory therapist will set it to a desired goal. If possible, sit up straight or lean slightly forward. Try not to slouch. Hold the incentive spirometer in an upright position. INSTRUCTIONS FOR USE  Sit on the edge of your bed if possible, or sit up as far as you can in bed or on a chair. Hold the incentive spirometer in an upright position. Breathe out normally. Place the mouthpiece in your mouth and seal your lips tightly around it. Breathe in slowly and as deeply as possible, raising the piston or the ball toward the top of the column. Hold your breath for 3-5 seconds or for as long as possible. Allow the piston or ball to fall to the bottom of the column. Remove the mouthpiece from your mouth and breathe out normally. Rest for a few seconds and repeat Steps 1 through 7 at least 10 times every 1-2 hours when you are awake. Take your time and take a few normal breaths between deep breaths. The spirometer may include an indicator to show your best effort. Use the indicator as a goal to work toward during each repetition. After each set of 10 deep breaths, practice coughing to be sure your lungs are clear. If you have an incision (the cut made at the time of surgery), support your incision when coughing by placing a pillow or rolled up towels firmly against it. Once you are able to get out of bed, walk around indoors and cough well. You may stop using the incentive spirometer when instructed by your caregiver.  RISKS AND COMPLICATIONS Take your time  so you do not get dizzy or light-headed. If you are in pain, you may need to take or ask for pain medication before doing incentive spirometry. It is harder to take a deep breath if you are having pain. AFTER USE Rest and breathe slowly and easily. It can be helpful to keep track of  a log of your progress. Your caregiver can provide you with a simple table to help with this. If you are using the spirometer at home, follow these instructions: [place] IF:  You are having difficultly using the spirometer. You have trouble using the spirometer as often as instructed. Your pain medication is not giving enough relief while using the spirometer. You develop fever of 100.5 F (38.1 C) or higher. SEEK IMMEDIATE MEDICAL CARE IF:  You cough up bloody sputum that had not been present before. You develop fever of 102 F (38.9 C) or greater. You develop worsening pain at or near the incision site. MAKE SURE YOU:  Understand these instructions. Will watch your condition. Will get help right away if you are not doing well or get worse. Document Released: 06/08/2006 Document Revised: 04/20/2011 Document Reviewed: 08/09/2006 [facility] Patient Information 2014 [place], [place].   ________________________________________________________________________

## 2022-06-30 ENCOUNTER — Other Ambulatory Visit: Payer: Self-pay

## 2022-06-30 ENCOUNTER — Encounter (HOSPITAL_COMMUNITY)
Admission: RE | Admit: 2022-06-30 | Discharge: 2022-06-30 | Disposition: A | Discharge status: Home / Self Care | Payer: PPO | Source: Ambulatory Visit | Attending: Orthopedic Surgery | Admitting: Orthopedic Surgery

## 2022-06-30 ENCOUNTER — Encounter (HOSPITAL_COMMUNITY): Payer: Self-pay

## 2022-06-30 VITALS — HR 70 | Temp 98.6°F | Ht 65.0 in | Wt 150.0 lb

## 2022-06-30 DIAGNOSIS — Z01818 Encounter for other preprocedural examination: Secondary | ICD-10-CM | POA: Diagnosis not present

## 2022-06-30 DIAGNOSIS — I10 Essential (primary) hypertension: Secondary | ICD-10-CM | POA: Diagnosis not present

## 2022-06-30 HISTORY — DX: Systemic lupus erythematosus, unspecified: M32.9

## 2022-06-30 HISTORY — DX: Reserved for concepts with insufficient information to code with codable children: IMO0002

## 2022-06-30 LAB — CBC
HCT: 41.6 % (ref 36.0–46.0)
Hemoglobin: 13 g/dL (ref 12.0–15.0)
MCH: 29.8 pg (ref 26.0–34.0)
MCHC: 31.3 g/dL (ref 30.0–36.0)
MCV: 95.4 fL (ref 80.0–100.0)
Platelets: 301 10*3/uL (ref 150–400)
RBC: 4.36 MIL/uL (ref 3.87–5.11)
RDW: 13.9 % (ref 11.5–15.5)
WBC: 8 10*3/uL (ref 4.0–10.5)
nRBC: 0 % (ref 0.0–0.2)

## 2022-06-30 LAB — BASIC METABOLIC PANEL
Anion gap: 9 (ref 5–15)
BUN: 9 mg/dL (ref 8–23)
CO2: 29 mmol/L (ref 22–32)
Calcium: 8.9 mg/dL (ref 8.9–10.3)
Chloride: 103 mmol/L (ref 98–111)
Creatinine, Ser: 0.9 mg/dL (ref 0.44–1.00)
GFR, Estimated: >60 mL/min (ref >60)
Glucose, Bld: 86 mg/dL (ref 70–99)
Potassium: 3 mmol/L — ABNORMAL LOW (ref 3.5–5.1)
Sodium: 141 mmol/L (ref 135–145)

## 2022-06-30 LAB — SURGICAL PCR SCREEN
MRSA, PCR: NEGATIVE
Staphylococcus aureus: NEGATIVE

## 2022-06-30 NOTE — Progress Notes (Addendum)
For Short Stay: COVID SWAB appointment date:  Bowel Prep reminder:   For Anesthesia: PCP - Dr. Quintin Alto. LOV: 04/2022 Cardiologist -   Chest x-ray - CT Chest: 04/30/22 EKG - 06/30/22 Stress Test -  ECHO - 07/16/20 Cardiac Cath -  Pacemaker/ICD device last checked: Pacemaker orders received: Device Rep notified:  Spinal Cord Stimulator: N/A  Sleep Study - N/A CPAP -   Fasting Blood Sugar - N/A Checks Blood Sugar _____ times a day Date and result of last Hgb A1c-  Last dose of GLP1 agonist- N/A GLP1 instructions:   Last dose of SGLT-2 inhibitors- N/A SGLT-2 instructions:   Blood Thinner Instructions: Aspirin Instructions:On hold since: 06/30/22 Last Dose:  Activity level: Can go up a flight of stairs and activities of daily living without stopping and without chest pain and/or shortness of breath   Able to exercise without chest pain and/or shortness of breath  Anesthesia review: Hx: HTN  Patient denies shortness of breath, fever, cough and chest pain at PAT appointment   Patient verbalized understanding of instructions that were given to them at the PAT appointment. Patient was also instructed that they will need to review over the PAT instructions again at home before surgery.

## 2022-07-12 NOTE — Anesthesia Preprocedure Evaluation (Signed)
Anesthesia Evaluation  Patient identified by MRN, date of birth, ID band Patient awake    Reviewed: Allergy & Precautions, NPO status , Patient's Chart, lab work & pertinent test results  History of Anesthesia Complications (+) PONV and history of anesthetic complications  Airway Mallampati: II  TM Distance: >3 FB Neck ROM: Full    Dental no notable dental hx. (+) Teeth Intact, Dental Advisory Given, Implants   Pulmonary    Pulmonary exam normal breath sounds clear to auscultation       Cardiovascular hypertension, Normal cardiovascular exam Rhythm:Regular Rate:Normal     Neuro/Psych negative neurological ROS  negative psych ROS   GI/Hepatic   Endo/Other  Hypothyroidism    Renal/GU negative Renal ROS     Musculoskeletal  (+) Arthritis ,    Abdominal   Peds  Hematology   Anesthesia Other Findings Lupus  Reproductive/Obstetrics                             Anesthesia Physical Anesthesia Plan  ASA: 3  Anesthesia Plan: General   Post-op Pain Management: Precedex   Induction: Intravenous  PONV Risk Score and Plan: 4 or greater and Treatment may vary due to age or medical condition, Midazolam, Ondansetron and Dexamethasone  Airway Management Planned: LMA  Additional Equipment: None  Intra-op Plan:   Post-operative Plan: Extubation in OR  Informed Consent: I have reviewed the patients History and Physical, chart, labs and discussed the procedure including the risks, benefits and alternatives for the proposed anesthesia with the patient or authorized representative who has indicated his/her understanding and acceptance.     Dental advisory given  Plan Discussed with: CRNA  Anesthesia Plan Comments:         Anesthesia Quick Evaluation

## 2022-07-13 ENCOUNTER — Ambulatory Visit (HOSPITAL_COMMUNITY)
Admission: RE | Admit: 2022-07-13 | Discharge: 2022-07-13 | Disposition: A | Discharge status: Home / Self Care | Payer: PPO | Source: Ambulatory Visit | Attending: Orthopedic Surgery | Admitting: Orthopedic Surgery

## 2022-07-13 ENCOUNTER — Ambulatory Visit (HOSPITAL_COMMUNITY): Payer: PPO | Admitting: Anesthesiology

## 2022-07-13 ENCOUNTER — Encounter (HOSPITAL_COMMUNITY)
Admission: RE | Disposition: A | Discharge status: Home / Self Care | Payer: Self-pay | Source: Ambulatory Visit | Attending: Orthopedic Surgery

## 2022-07-13 ENCOUNTER — Other Ambulatory Visit: Payer: Self-pay

## 2022-07-13 ENCOUNTER — Ambulatory Visit (HOSPITAL_BASED_OUTPATIENT_CLINIC_OR_DEPARTMENT_OTHER): Payer: PPO | Admitting: Anesthesiology

## 2022-07-13 ENCOUNTER — Encounter (HOSPITAL_COMMUNITY): Payer: Self-pay | Admitting: Orthopedic Surgery

## 2022-07-13 DIAGNOSIS — X58XXXA Exposure to other specified factors, initial encounter: Secondary | ICD-10-CM | POA: Insufficient documentation

## 2022-07-13 DIAGNOSIS — M7062 Trochanteric bursitis, left hip: Secondary | ICD-10-CM | POA: Diagnosis not present

## 2022-07-13 DIAGNOSIS — S76012A Strain of muscle, fascia and tendon of left hip, initial encounter: Secondary | ICD-10-CM | POA: Diagnosis not present

## 2022-07-13 DIAGNOSIS — E039 Hypothyroidism, unspecified: Secondary | ICD-10-CM | POA: Diagnosis not present

## 2022-07-13 DIAGNOSIS — I10 Essential (primary) hypertension: Secondary | ICD-10-CM | POA: Insufficient documentation

## 2022-07-13 DIAGNOSIS — M329 Systemic lupus erythematosus, unspecified: Secondary | ICD-10-CM | POA: Diagnosis not present

## 2022-07-13 DIAGNOSIS — M7072 Other bursitis of hip, left hip: Secondary | ICD-10-CM

## 2022-07-13 HISTORY — PX: EXCISION/RELEASE BURSA HIP: SHX5014

## 2022-07-13 HISTORY — PX: OPEN SURGICAL REPAIR OF GLUTEAL TENDON: SHX5995

## 2022-07-13 SURGERY — REPAIR, TENDON, GLUTEUS MEDIUS, OPEN
Anesthesia: General | Site: Hip | Laterality: Left

## 2022-07-13 MED ORDER — POVIDONE-IODINE 10 % EX SWAB
2.0000 | Freq: Once | CUTANEOUS | Status: AC
  Administered 2022-07-13: 2 via TOPICAL

## 2022-07-13 MED ORDER — PROPOFOL 10 MG/ML IV BOLUS
INTRAVENOUS | Status: AC
  Filled 2022-07-13: qty 20

## 2022-07-13 MED ORDER — FENTANYL CITRATE (PF) 100 MCG/2ML IJ SOLN
INTRAMUSCULAR | Status: DC | PRN
  Administered 2022-07-13 (×2): 25 ug via INTRAVENOUS
  Administered 2022-07-13 (×2): 50 ug via INTRAVENOUS

## 2022-07-13 MED ORDER — HYDROMORPHONE HCL 1 MG/ML IJ SOLN
0.2500 mg | INTRAMUSCULAR | Status: DC | PRN
  Administered 2022-07-13: 0.25 mg via INTRAVENOUS
  Administered 2022-07-13: 0.5 mg via INTRAVENOUS
  Administered 2022-07-13: 0.25 mg via INTRAVENOUS

## 2022-07-13 MED ORDER — LIDOCAINE HCL (CARDIAC) PF 100 MG/5ML IV SOSY: PREFILLED_SYRINGE | INTRAVENOUS | Status: DC | PRN

## 2022-07-13 MED ORDER — LACTATED RINGERS IV SOLN: INTRAVENOUS | Status: DC

## 2022-07-13 MED ORDER — METHOCARBAMOL 500 MG PO TABS: 500.0000 mg | ORAL_TABLET | Freq: Four times a day (QID) | ORAL | 0 refills | Status: DC | PRN

## 2022-07-13 MED ORDER — PROPOFOL 10 MG/ML IV BOLUS
INTRAVENOUS | Status: DC | PRN
  Administered 2022-07-13: 80 mg via INTRAVENOUS
  Administered 2022-07-13 (×3): 40 mg via INTRAVENOUS
  Administered 2022-07-13: 50 mg via INTRAVENOUS

## 2022-07-13 MED ORDER — HYDROCODONE-ACETAMINOPHEN 5-325 MG PO TABS: 1.0000 | ORAL_TABLET | Freq: Four times a day (QID) | ORAL | 0 refills | Status: DC | PRN

## 2022-07-13 MED ORDER — ONDANSETRON HCL 4 MG/2ML IJ SOLN
INTRAMUSCULAR | Status: DC | PRN
  Administered 2022-07-13: 4 mg via INTRAVENOUS

## 2022-07-13 MED ORDER — ONDANSETRON HCL 4 MG/2ML IJ SOLN
INTRAMUSCULAR | Status: AC
  Filled 2022-07-13: qty 2

## 2022-07-13 MED ORDER — DEXAMETHASONE SODIUM PHOSPHATE 10 MG/ML IJ SOLN
8.0000 mg | Freq: Once | INTRAMUSCULAR | Status: AC
  Administered 2022-07-13: 8 mg via INTRAVENOUS

## 2022-07-13 MED ORDER — BUPIVACAINE HCL (PF) 0.25 % IJ SOLN
INTRAMUSCULAR | Status: DC | PRN
  Administered 2022-07-13: 30 mL

## 2022-07-13 MED ORDER — ONDANSETRON HCL 4 MG/2ML IJ SOLN
4.0000 mg | Freq: Once | INTRAMUSCULAR | Status: AC | PRN
  Administered 2022-07-13: 4 mg via INTRAVENOUS

## 2022-07-13 MED ORDER — OXYCODONE HCL 5 MG PO TABS
ORAL_TABLET | ORAL | Status: AC
  Filled 2022-07-13: qty 1

## 2022-07-13 MED ORDER — HYDROMORPHONE HCL 1 MG/ML IJ SOLN
INTRAMUSCULAR | Status: AC
  Administered 2022-07-13: 0.25 mg via INTRAVENOUS
  Filled 2022-07-13: qty 1

## 2022-07-13 MED ORDER — DEXMEDETOMIDINE HCL IN NACL 80 MCG/20ML IV SOLN
INTRAVENOUS | Status: AC
  Filled 2022-07-13: qty 20

## 2022-07-13 MED ORDER — FENTANYL CITRATE (PF) 100 MCG/2ML IJ SOLN
INTRAMUSCULAR | Status: AC
  Filled 2022-07-13: qty 2

## 2022-07-13 MED ORDER — METHOCARBAMOL 500 MG PO TABS: 500.0000 mg | ORAL_TABLET | Freq: Four times a day (QID) | ORAL | Status: DC | PRN

## 2022-07-13 MED ORDER — DEXAMETHASONE SODIUM PHOSPHATE 10 MG/ML IJ SOLN
INTRAMUSCULAR | Status: AC
  Filled 2022-07-13: qty 1

## 2022-07-13 MED ORDER — OXYCODONE HCL 5 MG PO TABS
5.0000 mg | ORAL_TABLET | Freq: Once | ORAL | Status: AC | PRN
  Administered 2022-07-13: 5 mg via ORAL

## 2022-07-13 MED ORDER — CHLORHEXIDINE GLUCONATE 4 % EX SOLN: 60.0000 mL | Freq: Once | CUTANEOUS | Status: DC

## 2022-07-13 MED ORDER — DEXMEDETOMIDINE HCL IN NACL 80 MCG/20ML IV SOLN
INTRAVENOUS | Status: DC | PRN
  Administered 2022-07-13: 4 ug via INTRAVENOUS
  Administered 2022-07-13: 12 ug via INTRAVENOUS

## 2022-07-13 MED ORDER — ACETAMINOPHEN 10 MG/ML IV SOLN
INTRAVENOUS | Status: AC
  Filled 2022-07-13: qty 100

## 2022-07-13 MED ORDER — ACETAMINOPHEN 10 MG/ML IV SOLN
INTRAVENOUS | Status: DC | PRN
  Administered 2022-07-13: 1000 mg via INTRAVENOUS

## 2022-07-13 MED ORDER — PHENYLEPHRINE 80 MCG/ML (10ML) SYRINGE FOR IV PUSH (FOR BLOOD PRESSURE SUPPORT)
PREFILLED_SYRINGE | INTRAVENOUS | Status: DC | PRN
  Administered 2022-07-13 (×2): 80 ug via INTRAVENOUS

## 2022-07-13 MED ORDER — 0.9 % SODIUM CHLORIDE (POUR BTL) OPTIME
TOPICAL | Status: DC | PRN
  Administered 2022-07-13: 1000 mL

## 2022-07-13 MED ORDER — METHOCARBAMOL 500 MG IVPB - SIMPLE MED
500.0000 mg | Freq: Four times a day (QID) | INTRAVENOUS | Status: DC | PRN
  Administered 2022-07-13: 500 mg via INTRAVENOUS

## 2022-07-13 MED ORDER — OXYCODONE HCL 5 MG/5ML PO SOLN: 5.0000 mg | Freq: Once | ORAL | Status: AC | PRN

## 2022-07-13 MED ORDER — ACETAMINOPHEN 10 MG/ML IV SOLN: 1000.0000 mg | Freq: Once | INTRAVENOUS | Status: DC | PRN

## 2022-07-13 MED ORDER — ORAL CARE MOUTH RINSE: 15.0000 mL | Freq: Once | OROMUCOSAL | Status: AC

## 2022-07-13 MED ORDER — CHLORHEXIDINE GLUCONATE 0.12 % MT SOLN
15.0000 mL | Freq: Once | OROMUCOSAL | Status: AC
  Administered 2022-07-13: 15 mL via OROMUCOSAL

## 2022-07-13 MED ORDER — LIDOCAINE HCL (PF) 2 % IJ SOLN
INTRAMUSCULAR | Status: AC
  Filled 2022-07-13: qty 5

## 2022-07-13 MED ORDER — LIDOCAINE HCL (CARDIAC) PF 100 MG/5ML IV SOSY
PREFILLED_SYRINGE | INTRAVENOUS | Status: DC | PRN
  Administered 2022-07-13: 80 mg via INTRAVENOUS

## 2022-07-13 MED ORDER — METHOCARBAMOL 500 MG IVPB - SIMPLE MED
INTRAVENOUS | Status: AC
  Filled 2022-07-13: qty 55

## 2022-07-13 MED ORDER — CEFAZOLIN SODIUM-DEXTROSE 2-4 GM/100ML-% IV SOLN
2.0000 g | INTRAVENOUS | Status: AC
  Administered 2022-07-13: 2 g via INTRAVENOUS
  Filled 2022-07-13: qty 100

## 2022-07-13 MED ORDER — BUPIVACAINE HCL 0.25 % IJ SOLN
INTRAMUSCULAR | Status: AC
  Filled 2022-07-13: qty 1

## 2022-07-13 MED ORDER — PHENYLEPHRINE 80 MCG/ML (10ML) SYRINGE FOR IV PUSH (FOR BLOOD PRESSURE SUPPORT)
PREFILLED_SYRINGE | INTRAVENOUS | Status: AC
  Filled 2022-07-13: qty 10

## 2022-07-13 SURGICAL SUPPLY — 58 items
ADH SKN CLS APL DERMABOND .7 (GAUZE/BANDAGES/DRESSINGS) ×1
ANCH SUT SWLK 19.1X4.75 (Anchor) ×1 IMPLANT
ANCHOR SUT BIO SW 4.75X19.1 (Anchor) IMPLANT
BAG COUNTER SPONGE SURGICOUNT (BAG) IMPLANT
BAG SPEC THK2 15X12 ZIP CLS (MISCELLANEOUS) ×1
BAG SPNG CNTER NS LX DISP (BAG)
BAG ZIPLOCK 12X15 (MISCELLANEOUS) IMPLANT
BIT DRILL 2.4X128 (BIT) IMPLANT
BIT DRILL 2.8X128 (BIT) IMPLANT
BLADE EXTENDED COATED 6.5IN (ELECTRODE) ×1 IMPLANT
COVER SURGICAL LIGHT HANDLE (MISCELLANEOUS) ×1 IMPLANT
DERMABOND ADVANCED .7 DNX12 (GAUZE/BANDAGES/DRESSINGS) IMPLANT
DRAPE INCISE IOBAN 66X45 STRL (DRAPES) ×1 IMPLANT
DRAPE ORTHO SPLIT 77X108 STRL (DRAPES) ×2
DRAPE POUCH INSTRU U-SHP 10X18 (DRAPES) ×1 IMPLANT
DRAPE SURG ORHT 6 SPLT 77X108 (DRAPES) ×2 IMPLANT
DRAPE U-SHAPE 47X51 STRL (DRAPES) ×1 IMPLANT
DRESSING MEPILEX FLEX 4X4 (GAUZE/BANDAGES/DRESSINGS) IMPLANT
DRSG ADAPTIC 3X8 NADH LF (GAUZE/BANDAGES/DRESSINGS) ×1 IMPLANT
DRSG AQUACEL AG ADV 3.5X10 (GAUZE/BANDAGES/DRESSINGS) ×1 IMPLANT
DRSG MEPILEX FLEX 4X4 (GAUZE/BANDAGES/DRESSINGS)
DURAPREP 26ML APPLICATOR (WOUND CARE) ×1 IMPLANT
ELECT REM PT RETURN 15FT ADLT (MISCELLANEOUS) ×1 IMPLANT
EVACUATOR 1/8 PVC DRAIN (DRAIN) IMPLANT
FACESHIELD WRAPAROUND (MASK) IMPLANT
FACESHIELD WRAPAROUND OR TEAM (MASK) ×2 IMPLANT
GAUZE SPONGE 4X4 12PLY STRL (GAUZE/BANDAGES/DRESSINGS) ×1 IMPLANT
GLOVE BIO SURGEON STRL SZ 6.5 (GLOVE) IMPLANT
GLOVE BIO SURGEON STRL SZ7.5 (GLOVE) IMPLANT
GLOVE BIO SURGEON STRL SZ8 (GLOVE) ×1 IMPLANT
GLOVE BIOGEL PI IND STRL 6.5 (GLOVE) IMPLANT
GLOVE BIOGEL PI IND STRL 7.0 (GLOVE) ×1 IMPLANT
GLOVE BIOGEL PI IND STRL 8 (GLOVE) ×1 IMPLANT
GOWN STRL REUS W/ TWL LRG LVL3 (GOWN DISPOSABLE) ×2 IMPLANT
GOWN STRL REUS W/TWL LRG LVL3 (GOWN DISPOSABLE) ×2
KIT BASIN OR (CUSTOM PROCEDURE TRAY) ×1 IMPLANT
KIT TURNOVER KIT A (KITS) IMPLANT
MANIFOLD NEPTUNE II (INSTRUMENTS) ×1 IMPLANT
NDL MA TROC 1/2 (NEEDLE) ×1 IMPLANT
NDL SAFETY ECLIP 18X1.5 (MISCELLANEOUS) ×2 IMPLANT
NEEDLE MA TROC 1/2 (NEEDLE) ×1 IMPLANT
NS IRRIG 1000ML POUR BTL (IV SOLUTION) ×1 IMPLANT
PACK TOTAL JOINT (CUSTOM PROCEDURE TRAY) ×1 IMPLANT
PASSER SUT SWANSON 36MM LOOP (INSTRUMENTS) IMPLANT
PIN SET MODULAR GLENOID SYSTEM (PIN) IMPLANT
PROTECTOR NERVE ULNAR (MISCELLANEOUS) ×1 IMPLANT
STAPLER VISISTAT 35W (STAPLE) IMPLANT
STRIP CLOSURE SKIN 1/2X4 (GAUZE/BANDAGES/DRESSINGS) ×2 IMPLANT
SUT ETHIBOND NAB CT1 #1 30IN (SUTURE) IMPLANT
SUT MNCRL AB 4-0 PS2 18 (SUTURE) ×1 IMPLANT
SUT STRATAFIX 0 PDS 27 VIOLET (SUTURE) ×1
SUT VIC AB 2-0 CT1 27 (SUTURE) ×2
SUT VIC AB 2-0 CT1 TAPERPNT 27 (SUTURE) ×2 IMPLANT
SUTURE STRATFX 0 PDS 27 VIOLET (SUTURE) ×1 IMPLANT
SUTURE TAPE 1.3 40 TPR END (SUTURE) IMPLANT
SUTURETAPE 1.3 40 TPR END (SUTURE) ×1
SYR 50ML LL SCALE MARK (SYRINGE) ×1 IMPLANT
TOWEL OR 17X26 10 PK STRL BLUE (TOWEL DISPOSABLE) ×2 IMPLANT

## 2022-07-13 NOTE — Interval H&P Note (Signed)
History and Physical Interval Note:  07/13/2022 11:57 AM  Sheryl Hamilton  has presented today for surgery, with the diagnosis of Left hip trochanteric bursitis; gluteal tendon tear.  The various methods of treatment have been discussed with the patient and family. After consideration of risks, benefits and other options for treatment, the patient has consented to  Procedure(s): Left hip bursectomy; gluteal tendon repair (Left) EXCISION/RELEASE BURSA HIP (Left) as a surgical intervention.  The patient's history has been reviewed, patient examined, no change in status, stable for surgery.  I have reviewed the patient's chart and labs.  Questions were answered to the patient's satisfaction.     Homero Fellers Mirinda Monte

## 2022-07-13 NOTE — Transfer of Care (Signed)
Immediate Anesthesia Transfer of Care Note  Patient: Sheryl Hamilton  Procedure(s) Performed: Left hip bursectomy; gluteal tendon repair (Left: Hip) EXCISION/RELEASE BURSA HIP (Left: Hip)  Patient Location: PACU  Anesthesia Type:General  Level of Consciousness: drowsy and patient cooperative  Airway & Oxygen Therapy: Patient Spontanous Breathing and Patient connected to face mask oxygen  Post-op Assessment: Report given to RN and Post -op Vital signs reviewed and stable  Post vital signs: Reviewed and stable  Last Vitals:  Vitals Value Taken Time  BP 160/95 07/13/22 1516  Temp    Pulse 95 07/13/22 1519  Resp 10 07/13/22 1519  SpO2 100 % 07/13/22 1519  Vitals shown include unvalidated device data.  Last Pain:  Vitals:   07/13/22 1205  TempSrc:   PainSc: 0-No pain         Complications: No notable events documented.

## 2022-07-13 NOTE — Anesthesia Procedure Notes (Signed)
Procedure Name: LMA Insertion Date/Time: 07/13/2022 2:06 PM  Performed by: Oletha Cruel, CRNAPre-anesthesia Checklist: Patient identified, Emergency Drugs available, Suction available, Patient being monitored and Timeout performed Patient Re-evaluated:Patient Re-evaluated prior to induction Oxygen Delivery Method: Circle system utilized Preoxygenation: Pre-oxygenation with 100% oxygen Induction Type: IV induction Ventilation: Mask ventilation without difficulty LMA: LMA inserted LMA Size: 4.0 Number of attempts: 1 Placement Confirmation: positive ETCO2 and breath sounds checked- equal and bilateral Tube secured with: Tape Dental Injury: Teeth and Oropharynx as per pre-operative assessment

## 2022-07-13 NOTE — H&P (Signed)
HISTORY & PHYSICAL  Subjective:  Chief Complaint: Left hip pain  HPI: Sheryl Hamilton, 72 y.o. female, has a history of chronic left lateral hip pain. She previously had an MRI and subsequent trochanteric bursitis injection in January 2024. The injection provided little relief, so she proceeded with physical therapy. This was somewhat beneficial for her discomfort, however, the pain remains localized over the lateral hip. It will sometimes radiate down the lateral aspect of the leg to the knee. She reports that when she is walking and her left foot strikes the ground, she can feel increased pressure in the hip. She has also noticed that she will limp with her walk. Denies any numbness or tingling down the legs. She is not taking any medications for pain.  Patient Active Problem List   Diagnosis Date Noted   Primary osteoarthritis of right knee 09/04/2019   OA (osteoarthritis) of knee 06/11/2014   Cough 10/17/2012   HBP (high blood pressure) 10/17/2012   Precordial pain 10/14/2012    Past Medical History:  Diagnosis Date   Allergy    seasonal   Arthritis    Complication of anesthesia    Hypertension    Hypothyroid    Lupus (HCC)    PONV (postoperative nausea and vomiting)     Past Surgical History:  Procedure Laterality Date   APPENDECTOMY     NECK SURGERY     Pinched Nerve   TONSILLECTOMY     TOTAL KNEE ARTHROPLASTY Left 06/11/2014   Procedure: LEFT TOTAL KNEE ARTHROPLASTY;  Surgeon: Ollen Gross, MD;  Location: [facility];  Service: Orthopedics;  Laterality: Left;   TOTAL KNEE ARTHROPLASTY Right 09/04/2019   Procedure: TOTAL KNEE ARTHROPLASTY;  Surgeon: Ollen Gross, MD;  Location: [facility];  Service: Orthopedics;  Laterality: Right;   VAGINAL HYSTERECTOMY      Prior to Admission medications   Medication Sig Start Date End Date Taking? Authorizing Provider  aspirin EC 81 MG tablet Take 81 mg by mouth daily. Swallow whole.   Yes [provider]  Cholecalciferol (VITAMIN  D) 50 MCG (2000 UT) CAPS Take 2,000 Units by mouth daily.   Yes [provider]  ciprofloxacin (CILOXAN) 0.3 % ophthalmic solution Place 1 drop into both eyes See admin instructions. Instill 1 drop into the left eye 4 times daily for 2 days after each monthly injection   Yes [provider]  hydrochlorothiazide (HYDRODIURIL) 12.5 MG tablet Take 12.5 mg by mouth daily.   Yes [provider]  levothyroxine (SYNTHROID) 50 MCG tablet Take 50 mcg by mouth every other day.   Yes [provider]  levothyroxine (SYNTHROID, LEVOTHROID) 75 MCG tablet Take 75 mcg by mouth every other day.   Yes [provider]  mycophenolate (CELLCEPT) 500 MG tablet Take 1,000 mg by mouth 2 (two) times daily. 08/10/19  Yes [provider]  PARoxetine (PAXIL) 20 MG tablet Take 20 mg by mouth daily.   Yes [provider]  gabapentin (NEURONTIN) 300 MG capsule Take a 300 mg capsule three times a day for two weeks following surgery.Then take a 300 mg capsule two times a day for two weeks. Then take a 300 mg capsule once a day for two weeks. Then discontinue. Patient not taking: Reported on 06/29/2022 09/05/19   Skylah Delauter, Lyn Hollingshead, PA  methocarbamol (ROBAXIN) 500 MG tablet Take 1 tablet (500 mg total) by mouth every 6 (six) hours as needed for muscle spasms. Patient not taking: Reported on 06/29/2022 09/05/19  Daizy Outen L, PA  oxyCODONE (OXY IR/ROXICODONE) 5 MG immediate release tablet Take 1-2 tablets (5-10 mg total) by mouth every 6 (six) hours as needed for severe pain. Not to exceed 6 tablets a day. Patient not taking: Reported on 06/29/2022 09/05/19   Bosten Newstrom, Lyn Hollingshead, PA    No Known Allergies  Social History   Socioeconomic History   Marital status: Divorced    Spouse name: Not on file   Number of children: 1   Years of education: Not on file   Highest education level: Not on file  Occupational History   Occupation: [employer]    Comment: Clerical  work   Tobacco Use   Smoking status: Never   Smokeless tobacco: Never  Building services engineer Use: Never used  Substance and Sexual Activity   Alcohol use: Not Currently    Comment: occasionally   Drug use: No   Sexual activity: Not on file  Other Topics Concern   Not on file  Social History Narrative   Lives alone.    Social Determinants of Health   Financial Resource Strain: Not on file  Food Insecurity: Not on file  Transportation Needs: Not on file  Physical Activity: Not on file  Stress: Not on file  Social Connections: Not on file  Intimate Partner Violence: Not on file    Tobacco Use: Low Risk  (06/30/2022)   Patient History    Smoking Tobacco Use: Never    Smokeless Tobacco Use: Never    Passive Exposure: Not on file   Social History   Substance and Sexual Activity  Alcohol Use Not Currently   Comment: occasionally    Family History  Problem Relation Age of Onset   Cancer Sister        Ovarian   Cancer Mother        Breast   Diabetes Sister    Irritable bowel syndrome Sister    Esophageal cancer Neg Hx    Stomach cancer Neg Hx     Review of Systems  Constitutional:  Negative for chills and fever.  HENT:  Negative for congestion, sore throat and tinnitus.   Eyes:  Negative for double vision, photophobia and pain.  Respiratory:  Negative for cough, shortness of breath and wheezing.   Cardiovascular:  Negative for chest pain, palpitations and orthopnea.  Gastrointestinal:  Negative for heartburn, nausea and vomiting.  Genitourinary:  Negative for dysuria, frequency and urgency.  Musculoskeletal:  Positive for joint pain.  Neurological:  Negative for dizziness, weakness and headaches.     Objective:  Physical Exam: Alert and oriented. No acute distress.  Minimally antalgic gait pattern favoring the left side. Ambulating without assistive devices.  Left Hip Exam: Significant tenderness to palpation at the greater trochanter. Palpation reproduces  the pain she is experiencing. Mild discomfort with active and passive hip abduction and internal rotation. Range of motion: Flexion to 120 degrees, internal rotation to 30 degrees, external rotation to 40 degrees, and abduction to 40 degrees.  No pain with provocative testing of the left hip.  Sensation and motor function intact in LE. Distal pulses 2+. Calves soft and nontender.  Imaging Review  MRI of the left hip demonstrates degenerative changes in the labrum with no associated secondary arthritic changes in the joint. There is some degeneration in the gluteus medius and minimus tendons without a tear.  Assessment/Plan:  Chronic greater trochanteric bursitis, left hip  Ms. Brickman has chronic trochanteric bursitis of the left  hip. She has tried previous conservative measures including activity modification, cortisone injections, and physical therapy without improvement. Risks and benefits of bursectomy with possible gluteal tendon repair were discussed at length with the patient and she elects to proceed. Surgery will be performed by Dr. Ollen Gross at [facility].   Arther Abbott, PA-C Orthopedic Surgery [facility] [place]

## 2022-07-13 NOTE — Op Note (Signed)
OPERATIVE REPORT   PREOPERATIVE DIAGNOSIS: Intractable bursitis,Left hip with gluteal  tendon tear.   POSTOPERATIVE DIAGNOSIS: Intractable bursitis, Left hip with gluteal  tendon tear.   PROCEDURE: Left  hip bursectomy with gluteal tendon repair.   SURGEON: Ollen Gross, M.D.   ASSISTANT: Roland Earl, PA-C   ANESTHESIA: General   ESTIMATED BLOOD LOSS: 10 ml   DRAINS: None.   COMPLICATIONS: None.   CONDITION: Stable to recovery.   BRIEF CLINICAL NOTE: Sheryl Hamilton is an 72 y.o. female  with  significant intractable  Left lateral hip pain. It has been going on for  a long time now. It is getting progressively worse over time. It has  not responded to intra-articular injection, and did have a partial  temporary response to a trochanteric injection. MRI showed bursitis and  probable gluteal tendon tear. The patient presents now for Left hip bursectomy and gluteal tendon repair.   PROCEDURE IN DETAIL: After successful administration of general  anesthetic, the patient was placed in the Right lateral decubitus  position with the Left  side up and held with the hip positioner. The   Left lower extremity was isolated from the perineum with plastic drapes  and prepped and draped in the usual sterile fashion. The lateral-based  incision was then made centered over the tip of the greater trochanter  about 4 inches in length. The skin was cut with a #10 blade through  subcutaneous tissue to the fascia lata which incised in line with the  skin incision. There was a fluid-filled bursa present underneath the  fascia lata. I resected this with electrocautery. I palpated the  gluteus medius tendon. There was an obvious tear approximately 1 x 2 cm  In the gluteus medius tendon centrally off the greater trochanter. I drilled for the Arthrex swivelock suture anchor and passed a #2 fiber tape through the tendon to advance it back to the trochanter. The suture is passed through the anchor  and the anchor screwed into the bone effectively locking the suture in place .This was very stable repair.      The wound was then copiously irrigated with  saline solution. The fascia lata was closed with  #1 Stratofix. Subcu was then closed with #1  and 2-0 Vicryl, subcuticular running 4-0 Monocryl. Note that, 20 mL of  0.25% Marcanie was injected into the fascia lata,  gluteal muscles, and subcu tissues. The subcuticular layer was closed  with running 4-0 Monocryl, and the incision cleaned and dried. Steri-  Strips and sterile dressing applied. The patient was then awakened and  transported to recovery in stable condition.   Ollen Gross, M.D.

## 2022-07-13 NOTE — Anesthesia Postprocedure Evaluation (Signed)
Anesthesia Post Note  Patient: Sheryl Hamilton  Procedure(s) Performed: Left hip bursectomy; gluteal tendon repair (Left: Hip) EXCISION/RELEASE BURSA HIP (Left: Hip)     Patient location during evaluation: PACU Anesthesia Type: General Level of consciousness: awake and alert Pain management: pain level controlled Vital Signs Assessment: post-procedure vital signs reviewed and stable Respiratory status: spontaneous breathing, nonlabored ventilation, respiratory function stable and patient connected to nasal cannula oxygen Cardiovascular status: blood pressure returned to baseline and stable Postop Assessment: no apparent nausea or vomiting Anesthetic complications: no   No notable events documented.  Last Vitals:  Vitals:   07/13/22 1617 07/13/22 1624  BP: 132/73   Pulse: 85 92  Resp: 14 12  Temp:    SpO2: 100% 91%    Last Pain:  Vitals:   07/13/22 1624  TempSrc:   PainSc: 8                  Trevor Iha

## 2022-07-13 NOTE — Discharge Instructions (Addendum)
 Sheryl Aluisio, MD [facility] [place] [address] [place], [place] [address] [phone]  BURSECTOMY / GLUTEAL TENDON REPAIR POSTOPERATIVE INSTRUCTIONS  HOME CARE INSTRUCTIONS  . Remove items at home which could result in a fall. This includes throw rugs or furniture in walking pathways.   ICE to the affected hip every three hours for 30 minutes at a time and then as needed for pain and swelling. Continue to use ice on the hip for pain and swelling from surgery. You may notice swelling that will progress down to the foot and ankle. This is normal after surgery. Elevate the leg when you are not up walking on it.    Continue to use the breathing machine which will help keep your temperature down. It is common for your temperature to cycle up and down following surgery, especially at night when you are not up moving around and exerting yourself. The breathing machine keeps your lungs expanded and your temperature down. . Sit on high chairs which makes it easier to stand.  . Sit on chairs with arms. Use the chair arms to help push yourself up when arising.   No active abduction of the leg (do not pull leg out to the side away from the body).  Use your walker for first several days until comfortable ambulating.  DIET You may resume your previous home diet once you are discharged from the hospital.  DRESSING / WOUND CARE / SHOWERING . You will have an adhesive waterproof bandage over the incision. Leave this in place until your first follow-up appointment. . You may shower three days after surgery, but do not submerge the incision under water.   ACTIVITY . Walk with your walker as instructed. . Use walker as long as suggested by your caregivers. . Avoid periods of inactivity such as sitting longer than an hour when not asleep. This helps prevent blood clots.  . You may resume a sexual relationship in one month or when given the OK by your  doctor.  . You may return to work once you are cleared by your doctor.  . Do not drive a car for 6 weeks or until released by you surgeon.  . Do not drive while taking narcotics.  WEIGHT BEARING Weight bearing as tolerated with assist device (walker, cane, etc) as directed, use it until comfortable ambulating.  POSTOPERATIVE CONSTIPATION PROTOCOL Constipation - defined medically as fewer than three stools per week and severe constipation as less than one stool per week.  One of the most common issues patients have following surgery is constipation.  Even if you have a regular bowel pattern at home, your normal regimen is likely to be disrupted due to multiple reasons following surgery.  Combination of anesthesia, postoperative narcotics, change in appetite and fluid intake all can affect your bowels.  In order to avoid complications following surgery, here are some recommendations in order to help you during your recovery period.  Colace (docusate) - Pick up an over-the-counter form of Colace or another stool softener and take twice a day as long as you are requiring postoperative pain medications.  Take with a full glass of water daily.  If you experience loose stools or diarrhea, hold the colace until you stool forms back up.  If your symptoms do not get better within 1 week or if they get worse, check with your doctor.  Dulcolax (bisacodyl) - Pick up over-the-counter and take as directed by the product packaging   as needed to assist with the movement of your bowels.  Take with a full glass of water.  Use this product as needed if not relieved by Colace only.   MiraLax (polyethylene glycol) - Pick up over-the-counter to have on hand.  MiraLax is a solution that will increase the amount of water in your bowels to assist with bowel movements.  Take as directed and can mix with a glass of water, juice, soda, coffee, or tea.  Take if you go more than two days without a movement. Do not use MiraLax  more than once per day. Call your doctor if you are still constipated or irregular after using this medication for 7 days in a row.  If you continue to have problems with postoperative constipation, please contact the office for further assistance and recommendations.  If you experience "the worst abdominal pain ever" or develop nausea or vomiting, please contact the office immediatly for further recommendations for treatment.  ITCHING  If you experience itching with your medications, try taking only a single pain pill, or even half a pain pill at a time.  You can also use Benadryl over the counter for itching or also to help with sleep.   TED HOSE STOCKINGS Wear the elastic stockings on both legs for three weeks following surgery during the day but you may remove then at night for sleeping.  MEDICATIONS See your medication summary on the "After Visit Summary" that the nursing staff will review with you prior to discharge.  You may have some home medications which will be placed on hold until you complete the course of blood thinner medication.  It is important for you to complete the blood thinner medication as prescribed by your surgeon.  Continue your approved medications as instructed at time of discharge.  PRECAUTIONS If you experience chest pain or shortness of breath - call 911 immediately for transfer to the hospital emergency department.  If you develop a fever greater that 101 F, purulent drainage from wound, increased redness or drainage from wound, foul odor from the wound/dressing, or calf pain - CONTACT YOUR SURGEON.                                                   FOLLOW-UP APPOINTMENTS Make sure you keep all of your appointments after your operation with your surgeon and caregivers. You should call the office at the above phone number and make an appointment for approximately two weeks after the date of your surgery or on the date instructed by your surgeon outlined in the "After  Visit Summary".   Pick up stool softner and laxative for home use following surgery while on pain medications. Do not submerge incision under water. Please use good hand washing techniques while changing dressing each day. May shower starting three days after surgery. Please use a clean towel to pat the incision dry following showers. Continue to use ice for pain and swelling after surgery. Do not use any lotions or creams on the incision until instructed by your surgeon.  

## 2022-07-14 ENCOUNTER — Encounter (HOSPITAL_COMMUNITY): Payer: Self-pay | Admitting: Orthopedic Surgery

## 2022-07-29 ENCOUNTER — Encounter (INDEPENDENT_AMBULATORY_CARE_PROVIDER_SITE_OTHER): Payer: PPO | Admitting: Ophthalmology

## 2022-07-29 DIAGNOSIS — H43813 Vitreous degeneration, bilateral: Secondary | ICD-10-CM | POA: Diagnosis not present

## 2022-07-29 DIAGNOSIS — H34832 Tributary (branch) retinal vein occlusion, left eye, with macular edema: Secondary | ICD-10-CM

## 2022-07-29 DIAGNOSIS — H35033 Hypertensive retinopathy, bilateral: Secondary | ICD-10-CM

## 2022-07-29 DIAGNOSIS — I10 Essential (primary) hypertension: Secondary | ICD-10-CM | POA: Diagnosis not present

## 2022-07-29 DIAGNOSIS — H33303 Unspecified retinal break, bilateral: Secondary | ICD-10-CM | POA: Diagnosis not present

## 2022-08-17 DIAGNOSIS — E039 Hypothyroidism, unspecified: Secondary | ICD-10-CM | POA: Diagnosis not present

## 2022-08-26 ENCOUNTER — Encounter (INDEPENDENT_AMBULATORY_CARE_PROVIDER_SITE_OTHER): Payer: PPO | Admitting: Ophthalmology

## 2022-08-26 DIAGNOSIS — I10 Essential (primary) hypertension: Secondary | ICD-10-CM | POA: Diagnosis not present

## 2022-08-26 DIAGNOSIS — H35033 Hypertensive retinopathy, bilateral: Secondary | ICD-10-CM

## 2022-08-26 DIAGNOSIS — H34832 Tributary (branch) retinal vein occlusion, left eye, with macular edema: Secondary | ICD-10-CM | POA: Diagnosis not present

## 2022-08-26 DIAGNOSIS — H43813 Vitreous degeneration, bilateral: Secondary | ICD-10-CM | POA: Diagnosis not present

## 2022-08-26 DIAGNOSIS — H33303 Unspecified retinal break, bilateral: Secondary | ICD-10-CM | POA: Diagnosis not present

## 2022-09-09 DIAGNOSIS — M810 Age-related osteoporosis without current pathological fracture: Secondary | ICD-10-CM | POA: Diagnosis not present

## 2022-09-15 DIAGNOSIS — Z5189 Encounter for other specified aftercare: Secondary | ICD-10-CM | POA: Diagnosis not present

## 2022-09-15 DIAGNOSIS — M25552 Pain in left hip: Secondary | ICD-10-CM | POA: Diagnosis not present

## 2022-09-15 DIAGNOSIS — M6281 Muscle weakness (generalized): Secondary | ICD-10-CM | POA: Diagnosis not present

## 2022-09-15 DIAGNOSIS — R262 Difficulty in walking, not elsewhere classified: Secondary | ICD-10-CM | POA: Diagnosis not present

## 2022-09-16 DIAGNOSIS — M329 Systemic lupus erythematosus, unspecified: Secondary | ICD-10-CM | POA: Diagnosis not present

## 2022-09-16 DIAGNOSIS — M3390 Dermatopolymyositis, unspecified, organ involvement unspecified: Secondary | ICD-10-CM | POA: Diagnosis not present

## 2022-09-16 DIAGNOSIS — Z79899 Other long term (current) drug therapy: Secondary | ICD-10-CM | POA: Diagnosis not present

## 2022-09-16 DIAGNOSIS — M810 Age-related osteoporosis without current pathological fracture: Secondary | ICD-10-CM | POA: Diagnosis not present

## 2022-09-16 DIAGNOSIS — I7300 Raynaud's syndrome without gangrene: Secondary | ICD-10-CM | POA: Diagnosis not present

## 2022-09-16 DIAGNOSIS — M609 Myositis, unspecified: Secondary | ICD-10-CM | POA: Diagnosis not present

## 2022-09-16 DIAGNOSIS — R748 Abnormal levels of other serum enzymes: Secondary | ICD-10-CM | POA: Diagnosis not present

## 2022-09-16 DIAGNOSIS — M1990 Unspecified osteoarthritis, unspecified site: Secondary | ICD-10-CM | POA: Diagnosis not present

## 2022-09-18 DIAGNOSIS — M6281 Muscle weakness (generalized): Secondary | ICD-10-CM | POA: Diagnosis not present

## 2022-09-18 DIAGNOSIS — R262 Difficulty in walking, not elsewhere classified: Secondary | ICD-10-CM | POA: Diagnosis not present

## 2022-09-18 DIAGNOSIS — M25552 Pain in left hip: Secondary | ICD-10-CM | POA: Diagnosis not present

## 2022-09-18 DIAGNOSIS — Z5189 Encounter for other specified aftercare: Secondary | ICD-10-CM | POA: Diagnosis not present

## 2022-09-22 DIAGNOSIS — H524 Presbyopia: Secondary | ICD-10-CM | POA: Diagnosis not present

## 2022-09-22 DIAGNOSIS — H40013 Open angle with borderline findings, low risk, bilateral: Secondary | ICD-10-CM | POA: Diagnosis not present

## 2022-09-22 DIAGNOSIS — M6281 Muscle weakness (generalized): Secondary | ICD-10-CM | POA: Diagnosis not present

## 2022-09-22 DIAGNOSIS — M25552 Pain in left hip: Secondary | ICD-10-CM | POA: Diagnosis not present

## 2022-09-22 DIAGNOSIS — R262 Difficulty in walking, not elsewhere classified: Secondary | ICD-10-CM | POA: Diagnosis not present

## 2022-09-22 DIAGNOSIS — Z5189 Encounter for other specified aftercare: Secondary | ICD-10-CM | POA: Diagnosis not present

## 2022-09-23 ENCOUNTER — Encounter (INDEPENDENT_AMBULATORY_CARE_PROVIDER_SITE_OTHER): Payer: PPO | Admitting: Ophthalmology

## 2022-09-23 DIAGNOSIS — H34832 Tributary (branch) retinal vein occlusion, left eye, with macular edema: Secondary | ICD-10-CM

## 2022-09-23 DIAGNOSIS — H35033 Hypertensive retinopathy, bilateral: Secondary | ICD-10-CM

## 2022-09-23 DIAGNOSIS — H43813 Vitreous degeneration, bilateral: Secondary | ICD-10-CM | POA: Diagnosis not present

## 2022-09-23 DIAGNOSIS — H33303 Unspecified retinal break, bilateral: Secondary | ICD-10-CM

## 2022-09-23 DIAGNOSIS — I10 Essential (primary) hypertension: Secondary | ICD-10-CM | POA: Diagnosis not present

## 2022-09-24 DIAGNOSIS — R262 Difficulty in walking, not elsewhere classified: Secondary | ICD-10-CM | POA: Diagnosis not present

## 2022-09-24 DIAGNOSIS — M25552 Pain in left hip: Secondary | ICD-10-CM | POA: Diagnosis not present

## 2022-09-24 DIAGNOSIS — M6281 Muscle weakness (generalized): Secondary | ICD-10-CM | POA: Diagnosis not present

## 2022-09-24 DIAGNOSIS — Z5189 Encounter for other specified aftercare: Secondary | ICD-10-CM | POA: Diagnosis not present

## 2022-09-29 DIAGNOSIS — M6281 Muscle weakness (generalized): Secondary | ICD-10-CM | POA: Diagnosis not present

## 2022-09-29 DIAGNOSIS — M25552 Pain in left hip: Secondary | ICD-10-CM | POA: Diagnosis not present

## 2022-09-29 DIAGNOSIS — Z5189 Encounter for other specified aftercare: Secondary | ICD-10-CM | POA: Diagnosis not present

## 2022-09-29 DIAGNOSIS — R262 Difficulty in walking, not elsewhere classified: Secondary | ICD-10-CM | POA: Diagnosis not present

## 2022-10-01 DIAGNOSIS — Z5189 Encounter for other specified aftercare: Secondary | ICD-10-CM | POA: Diagnosis not present

## 2022-10-01 DIAGNOSIS — M6281 Muscle weakness (generalized): Secondary | ICD-10-CM | POA: Diagnosis not present

## 2022-10-01 DIAGNOSIS — M25552 Pain in left hip: Secondary | ICD-10-CM | POA: Diagnosis not present

## 2022-10-01 DIAGNOSIS — R262 Difficulty in walking, not elsewhere classified: Secondary | ICD-10-CM | POA: Diagnosis not present

## 2022-10-29 ENCOUNTER — Encounter (INDEPENDENT_AMBULATORY_CARE_PROVIDER_SITE_OTHER): Payer: PPO | Admitting: Ophthalmology

## 2022-10-29 DIAGNOSIS — H34832 Tributary (branch) retinal vein occlusion, left eye, with macular edema: Secondary | ICD-10-CM | POA: Diagnosis not present

## 2022-10-29 DIAGNOSIS — H33303 Unspecified retinal break, bilateral: Secondary | ICD-10-CM | POA: Diagnosis not present

## 2022-10-29 DIAGNOSIS — I10 Essential (primary) hypertension: Secondary | ICD-10-CM

## 2022-10-29 DIAGNOSIS — H35033 Hypertensive retinopathy, bilateral: Secondary | ICD-10-CM | POA: Diagnosis not present

## 2022-10-29 DIAGNOSIS — H43813 Vitreous degeneration, bilateral: Secondary | ICD-10-CM | POA: Diagnosis not present

## 2022-10-30 DIAGNOSIS — M25552 Pain in left hip: Secondary | ICD-10-CM | POA: Diagnosis not present

## 2022-10-30 DIAGNOSIS — Z5189 Encounter for other specified aftercare: Secondary | ICD-10-CM | POA: Diagnosis not present

## 2022-11-12 DIAGNOSIS — E7849 Other hyperlipidemia: Secondary | ICD-10-CM | POA: Diagnosis not present

## 2022-11-12 DIAGNOSIS — I10 Essential (primary) hypertension: Secondary | ICD-10-CM | POA: Diagnosis not present

## 2022-11-12 DIAGNOSIS — E039 Hypothyroidism, unspecified: Secondary | ICD-10-CM | POA: Diagnosis not present

## 2022-11-12 DIAGNOSIS — N1831 Chronic kidney disease, stage 3a: Secondary | ICD-10-CM | POA: Diagnosis not present

## 2022-11-19 DIAGNOSIS — I10 Essential (primary) hypertension: Secondary | ICD-10-CM | POA: Diagnosis not present

## 2022-11-19 DIAGNOSIS — M329 Systemic lupus erythematosus, unspecified: Secondary | ICD-10-CM | POA: Diagnosis not present

## 2022-11-19 DIAGNOSIS — E039 Hypothyroidism, unspecified: Secondary | ICD-10-CM | POA: Diagnosis not present

## 2022-11-19 DIAGNOSIS — E876 Hypokalemia: Secondary | ICD-10-CM | POA: Diagnosis not present

## 2022-11-19 DIAGNOSIS — E7849 Other hyperlipidemia: Secondary | ICD-10-CM | POA: Diagnosis not present

## 2022-11-19 DIAGNOSIS — M3390 Dermatopolymyositis, unspecified, organ involvement unspecified: Secondary | ICD-10-CM | POA: Diagnosis not present

## 2022-11-19 DIAGNOSIS — Z0001 Encounter for general adult medical examination with abnormal findings: Secondary | ICD-10-CM | POA: Diagnosis not present

## 2022-11-19 DIAGNOSIS — Z1389 Encounter for screening for other disorder: Secondary | ICD-10-CM | POA: Diagnosis not present

## 2022-11-19 DIAGNOSIS — N1831 Chronic kidney disease, stage 3a: Secondary | ICD-10-CM | POA: Diagnosis not present

## 2022-11-19 DIAGNOSIS — Z1331 Encounter for screening for depression: Secondary | ICD-10-CM | POA: Diagnosis not present

## 2022-11-19 DIAGNOSIS — F339 Major depressive disorder, recurrent, unspecified: Secondary | ICD-10-CM | POA: Diagnosis not present

## 2022-11-19 DIAGNOSIS — Z23 Encounter for immunization: Secondary | ICD-10-CM | POA: Diagnosis not present

## 2022-12-03 ENCOUNTER — Encounter (INDEPENDENT_AMBULATORY_CARE_PROVIDER_SITE_OTHER): Payer: PPO | Admitting: Ophthalmology

## 2022-12-03 DIAGNOSIS — H43813 Vitreous degeneration, bilateral: Secondary | ICD-10-CM

## 2022-12-03 DIAGNOSIS — H33303 Unspecified retinal break, bilateral: Secondary | ICD-10-CM

## 2022-12-03 DIAGNOSIS — H35033 Hypertensive retinopathy, bilateral: Secondary | ICD-10-CM

## 2022-12-03 DIAGNOSIS — I10 Essential (primary) hypertension: Secondary | ICD-10-CM | POA: Diagnosis not present

## 2022-12-03 DIAGNOSIS — H34832 Tributary (branch) retinal vein occlusion, left eye, with macular edema: Secondary | ICD-10-CM | POA: Diagnosis not present

## 2022-12-04 DIAGNOSIS — M545 Low back pain, unspecified: Secondary | ICD-10-CM | POA: Diagnosis not present

## 2022-12-07 DIAGNOSIS — Z6823 Body mass index (BMI) 23.0-23.9, adult: Secondary | ICD-10-CM | POA: Diagnosis not present

## 2022-12-07 DIAGNOSIS — M3390 Dermatopolymyositis, unspecified, organ involvement unspecified: Secondary | ICD-10-CM | POA: Diagnosis not present

## 2022-12-07 DIAGNOSIS — M5416 Radiculopathy, lumbar region: Secondary | ICD-10-CM | POA: Diagnosis not present

## 2022-12-07 DIAGNOSIS — M329 Systemic lupus erythematosus, unspecified: Secondary | ICD-10-CM | POA: Diagnosis not present

## 2022-12-07 DIAGNOSIS — R030 Elevated blood-pressure reading, without diagnosis of hypertension: Secondary | ICD-10-CM | POA: Diagnosis not present

## 2022-12-08 DIAGNOSIS — M545 Low back pain, unspecified: Secondary | ICD-10-CM | POA: Diagnosis not present

## 2022-12-08 DIAGNOSIS — M6281 Muscle weakness (generalized): Secondary | ICD-10-CM | POA: Diagnosis not present

## 2022-12-08 DIAGNOSIS — M25552 Pain in left hip: Secondary | ICD-10-CM | POA: Diagnosis not present

## 2022-12-16 DIAGNOSIS — M545 Low back pain, unspecified: Secondary | ICD-10-CM | POA: Diagnosis not present

## 2022-12-16 DIAGNOSIS — M25552 Pain in left hip: Secondary | ICD-10-CM | POA: Diagnosis not present

## 2022-12-16 DIAGNOSIS — M6281 Muscle weakness (generalized): Secondary | ICD-10-CM | POA: Diagnosis not present

## 2022-12-17 DIAGNOSIS — M359 Systemic involvement of connective tissue, unspecified: Secondary | ICD-10-CM | POA: Diagnosis not present

## 2022-12-17 DIAGNOSIS — M1990 Unspecified osteoarthritis, unspecified site: Secondary | ICD-10-CM | POA: Diagnosis not present

## 2022-12-17 DIAGNOSIS — Z79899 Other long term (current) drug therapy: Secondary | ICD-10-CM | POA: Diagnosis not present

## 2022-12-17 DIAGNOSIS — M609 Myositis, unspecified: Secondary | ICD-10-CM | POA: Diagnosis not present

## 2022-12-17 DIAGNOSIS — R748 Abnormal levels of other serum enzymes: Secondary | ICD-10-CM | POA: Diagnosis not present

## 2022-12-17 DIAGNOSIS — M810 Age-related osteoporosis without current pathological fracture: Secondary | ICD-10-CM | POA: Diagnosis not present

## 2022-12-17 DIAGNOSIS — M3390 Dermatopolymyositis, unspecified, organ involvement unspecified: Secondary | ICD-10-CM | POA: Diagnosis not present

## 2022-12-17 DIAGNOSIS — I7300 Raynaud's syndrome without gangrene: Secondary | ICD-10-CM | POA: Diagnosis not present

## 2022-12-18 DIAGNOSIS — M25552 Pain in left hip: Secondary | ICD-10-CM | POA: Diagnosis not present

## 2022-12-18 DIAGNOSIS — M545 Low back pain, unspecified: Secondary | ICD-10-CM | POA: Diagnosis not present

## 2022-12-18 DIAGNOSIS — M6281 Muscle weakness (generalized): Secondary | ICD-10-CM | POA: Diagnosis not present

## 2022-12-21 DIAGNOSIS — M545 Low back pain, unspecified: Secondary | ICD-10-CM | POA: Diagnosis not present

## 2022-12-21 DIAGNOSIS — M533 Sacrococcygeal disorders, not elsewhere classified: Secondary | ICD-10-CM | POA: Diagnosis not present

## 2022-12-23 DIAGNOSIS — M25552 Pain in left hip: Secondary | ICD-10-CM | POA: Diagnosis not present

## 2022-12-23 DIAGNOSIS — M6281 Muscle weakness (generalized): Secondary | ICD-10-CM | POA: Diagnosis not present

## 2022-12-23 DIAGNOSIS — M545 Low back pain, unspecified: Secondary | ICD-10-CM | POA: Diagnosis not present

## 2022-12-24 DIAGNOSIS — M533 Sacrococcygeal disorders, not elsewhere classified: Secondary | ICD-10-CM | POA: Diagnosis not present

## 2022-12-25 DIAGNOSIS — M545 Low back pain, unspecified: Secondary | ICD-10-CM | POA: Diagnosis not present

## 2022-12-25 DIAGNOSIS — M6281 Muscle weakness (generalized): Secondary | ICD-10-CM | POA: Diagnosis not present

## 2022-12-25 DIAGNOSIS — M25552 Pain in left hip: Secondary | ICD-10-CM | POA: Diagnosis not present

## 2023-01-04 ENCOUNTER — Encounter (INDEPENDENT_AMBULATORY_CARE_PROVIDER_SITE_OTHER): Payer: PPO | Admitting: Ophthalmology

## 2023-01-04 DIAGNOSIS — I10 Essential (primary) hypertension: Secondary | ICD-10-CM | POA: Diagnosis not present

## 2023-01-04 DIAGNOSIS — H34832 Tributary (branch) retinal vein occlusion, left eye, with macular edema: Secondary | ICD-10-CM

## 2023-01-04 DIAGNOSIS — H43813 Vitreous degeneration, bilateral: Secondary | ICD-10-CM

## 2023-01-04 DIAGNOSIS — H33303 Unspecified retinal break, bilateral: Secondary | ICD-10-CM

## 2023-01-04 DIAGNOSIS — H35033 Hypertensive retinopathy, bilateral: Secondary | ICD-10-CM

## 2023-01-14 DIAGNOSIS — M7062 Trochanteric bursitis, left hip: Secondary | ICD-10-CM | POA: Diagnosis not present

## 2023-01-14 DIAGNOSIS — M545 Low back pain, unspecified: Secondary | ICD-10-CM | POA: Diagnosis not present

## 2023-01-14 DIAGNOSIS — M533 Sacrococcygeal disorders, not elsewhere classified: Secondary | ICD-10-CM | POA: Diagnosis not present

## 2023-02-01 ENCOUNTER — Encounter (INDEPENDENT_AMBULATORY_CARE_PROVIDER_SITE_OTHER): Payer: PPO | Admitting: Ophthalmology

## 2023-02-01 DIAGNOSIS — H35033 Hypertensive retinopathy, bilateral: Secondary | ICD-10-CM | POA: Diagnosis not present

## 2023-02-01 DIAGNOSIS — I10 Essential (primary) hypertension: Secondary | ICD-10-CM

## 2023-02-01 DIAGNOSIS — H34832 Tributary (branch) retinal vein occlusion, left eye, with macular edema: Secondary | ICD-10-CM

## 2023-02-01 DIAGNOSIS — H43813 Vitreous degeneration, bilateral: Secondary | ICD-10-CM | POA: Diagnosis not present

## 2023-02-01 DIAGNOSIS — H33303 Unspecified retinal break, bilateral: Secondary | ICD-10-CM

## 2023-02-05 DIAGNOSIS — M25552 Pain in left hip: Secondary | ICD-10-CM | POA: Diagnosis not present

## 2023-02-15 DIAGNOSIS — E039 Hypothyroidism, unspecified: Secondary | ICD-10-CM | POA: Diagnosis not present

## 2023-02-26 DIAGNOSIS — M25552 Pain in left hip: Secondary | ICD-10-CM | POA: Diagnosis not present

## 2023-03-03 NOTE — Progress Notes (Signed)
Sent message, via epic in basket, requesting orders in epic from surgeon.  

## 2023-03-08 ENCOUNTER — Encounter (INDEPENDENT_AMBULATORY_CARE_PROVIDER_SITE_OTHER): Payer: PPO | Admitting: Ophthalmology

## 2023-03-08 DIAGNOSIS — H43813 Vitreous degeneration, bilateral: Secondary | ICD-10-CM

## 2023-03-08 DIAGNOSIS — H33303 Unspecified retinal break, bilateral: Secondary | ICD-10-CM

## 2023-03-08 DIAGNOSIS — I10 Essential (primary) hypertension: Secondary | ICD-10-CM

## 2023-03-08 DIAGNOSIS — H35033 Hypertensive retinopathy, bilateral: Secondary | ICD-10-CM

## 2023-03-08 DIAGNOSIS — H34832 Tributary (branch) retinal vein occlusion, left eye, with macular edema: Secondary | ICD-10-CM

## 2023-03-09 NOTE — Progress Notes (Addendum)
COVID Vaccine received:  []  No [x]  Yes Date of any COVID positive Test in last 90 days:  PCP - Quintin Alto, MD  Cardiologist - none  Chest x-ray - 10-13-2012  Epic  CT chest 04-30-22 EKG -  06-30-2022 Epic Stress Test - 12-17-2020  [facility] ECHO - 07-16-20  [facility] Cardiac Cath -   PCR screen: []  Ordered & Completed []   No Order but Needs PROFEND     [x]   N/A for this surgery  Surgery Plan:  [x]  Ambulatory   []  Outpatient in bed  []  Admit Anesthesia:    []  General  []  Spinal  [x]   Choice []   MAC  Pacemaker / ICD device [x]  No []  Yes   Spinal Cord Stimulator:[x]  No []  Yes       History of Sleep Apnea? [x]  No []  Yes   CPAP used?- [x]  No []  Yes    Does the patient monitor blood sugar?   [x]  N/A   []  No []  Yes  Patient has: [x]  NO Hx DM   []  Pre-DM   []  DM1  []   DM2  Blood Thinner / Instructions:  none Aspirin Instructions:  ASA 81 mg   Hold x 1 week  ERAS Protocol Ordered: []  No  [x]  Yes PRE-SURGERY []  ENSURE  []  G2   [x]  No Drink Ordered Patient is to be NPO after: 1345  Dental hx: []  Dentures:  []  N/A      []  Bridge or Partial:                   []  Loose or Damaged teeth:   Comments: Patient had the same surgery in 07-2022  Activity level: Can go up a flight of stairs and activities of daily living without stopping and without chest pain and/or shortness of breath. Able to exercise without chest pain and/or shortness of breath   Anesthesia review:  HTN, Lupus, PONV  Patient denies shortness of breath, fever, cough and chest pain at PAT appointment.  Patient verbalized understanding and agreement to the Pre-Surgical Instructions that were given to them at this PAT appointment. Patient was also educated of the need to review these PAT instructions again prior to her surgery.I reviewed the appropriate phone numbers to call if they have any and questions or concerns.

## 2023-03-09 NOTE — Patient Instructions (Signed)
SURGICAL WAITING ROOM VISITATION Patients having surgery or a procedure may have no more than 2 support people in the waiting area - these visitors may rotate in the visitor waiting room.   Due to an increase in RSV and influenza rates and associated hospitalizations, children ages 60 and under may not visit patients in [facility] hospitals. If the patient needs to stay at the hospital during part of their recovery, the visitor guidelines for inpatient rooms apply.  PRE-OP VISITATION  Pre-op nurse will coordinate an appropriate time for 1 support person to accompany the patient in pre-op.  This support person may not rotate.  This visitor will be contacted when the time is appropriate for the visitor to come back in the pre-op area.  Please refer to the [facility] website for the visitor guidelines for Inpatients (after your surgery is over and you are in a regular room).  You are not required to quarantine at this time prior to your surgery. However, you must do this: Hand Hygiene often Do NOT share personal items Notify your provider if you are in close contact with someone who has COVID or you develop fever 100.4 or greater, new onset of sneezing, cough, sore throat, shortness of breath or body aches.  If you test positive for Covid or have been in contact with anyone that has tested positive in the last 10 days please notify you surgeon.    Your procedure is scheduled on:  Monday March 15, 2023  Report to [facility] Main Entrance: Leota Jacobsen entrance where the [employer] is available.   Report to admitting at: 2:30 PM  Call this number if you have any questions or problems the morning of surgery [phone]  Do not eat food after Midnight the night prior to your surgery/procedure.  After Midnight you may have the following liquids until   1:45 PM DAY OF SURGERY  Clear Liquid Diet Water Black Coffee (sugar ok, NO MILK/CREAM OR CREAMERS)  Tea (sugar ok, NO  MILK/CREAM OR CREAMERS) regular and decaf                             Plain Jell-O  with no fruit (NO RED)                                           Fruit ices (not with fruit pulp, NO RED)                                     Popsicles (NO RED)                                                                  Juice: NO CITRUS JUICES: only apple, WHITE grape, WHITE cranberry Sports drinks like Gatorade or Powerade (NO RED)                FOLLOW ANY ADDITIONAL PRE OP INSTRUCTIONS YOU RECEIVED FROM YOUR SURGEON'S OFFICE!!!   Oral Hygiene is also important to reduce your risk of infection.  Remember - BRUSH YOUR TEETH THE MORNING OF SURGERY WITH YOUR REGULAR TOOTHPASTE  Do NOT smoke after Midnight the night before surgery.  STOP TAKING all Vitamins, Herbs and supplements 1 week before your surgery.   Take ONLY these medicines the morning of surgery with A SIP OF WATER: levothyroxine, mycophenolate (Cellcept), paroxetine (Paxil)                    You may not have any metal on your body including hair pins, jewelry, and body piercing  Do not wear make-up, lotions, powders, perfumes or deodorant  Do not wear nail polish including gel and S&S, artificial / acrylic nails, or any other type of covering on natural nails including finger and toenails. If you have artificial nails, gel coating, etc., that needs to be removed by a nail salon, Please have this removed prior to surgery. Not doing so may mean that your surgery could be cancelled or delayed if the Surgeon or anesthesia staff feels like they are unable to monitor you safely.   Do not shave 48 hours prior to surgery to avoid nicks in your skin which may contribute to postoperative infections.   Contacts, Hearing Aids, dentures or bridgework may not be worn into surgery. DENTURES WILL BE REMOVED PRIOR TO SURGERY PLEASE DO NOT APPLY "Poly grip" OR ADHESIVES!!!  You may bring a small overnight bag with you on the day of surgery, only  pack items that are not valuable. [facility] IS NOT RESPONSIBLE   FOR VALUABLES THAT ARE LOST OR STOLEN.   Patients discharged on the day of surgery will not be allowed to drive home.  Someone NEEDS to stay with you for the first 24 hours after anesthesia.  Do not bring your home medications to the hospital. The Pharmacy will dispense medications listed on your medication list to you during your admission in the Hospital.  Please read over the following fact sheets you were given: IF YOU HAVE QUESTIONS ABOUT YOUR PRE-OP INSTRUCTIONS, PLEASE CALL [phone]   [facility] - Preparing for Surgery Before surgery, you can play an important role.  Because skin is not sterile, your skin needs to be as free of germs as possible.  You can reduce the number of germs on your skin by washing with CHG (chlorahexidine gluconate) soap before surgery.  CHG is an antiseptic cleaner which kills germs and bonds with the skin to continue killing germs even after washing. Please DO NOT use if you have an allergy to CHG or antibacterial soaps.  If your skin becomes reddened/irritated stop using the CHG and inform your nurse when you arrive at Short Stay. Do not shave (including legs and underarms) for at least 48 hours prior to the first CHG shower.  You may shave your face/neck.  Please follow these instructions carefully:  1.  Shower with CHG Soap the night before surgery and the  morning of surgery.  2.  If you choose to wash your hair, wash your hair first as usual with your normal  shampoo.  3.  After you shampoo, rinse your hair and body thoroughly to remove the shampoo.                             4.  Use CHG as you would any other liquid soap.  You can apply chg directly to the skin and wash.  Gently with a scrungie or clean washcloth.  5.  Apply the CHG Soap to your body ONLY FROM THE NECK DOWN.   Do not use on face/ open                           Wound or open sores. Avoid contact with eyes, ears mouth  and genitals (private parts).                       Wash face,  Genitals (private parts) with your normal soap.             6.  Wash thoroughly, paying special attention to the area where your  surgery  will be performed.  7.  Thoroughly rinse your body with warm water from the neck down.  8.  DO NOT shower/wash with your normal soap after using and rinsing off the CHG Soap.            9.  Pat yourself dry with a clean towel.            10.  Wear clean pajamas.            11.  Place clean sheets on your bed the night of your first shower and do not  sleep with pets.  ON THE DAY OF SURGERY : Do not apply any lotions/deodorants the morning of surgery.  Please wear clean clothes to the hospital/surgery center.     FAILURE TO FOLLOW THESE INSTRUCTIONS MAY RESULT IN THE CANCELLATION OF YOUR SURGERY  PATIENT SIGNATURE_________________________________  NURSE SIGNATURE__________________________________  ________________________________________________________________________       Rogelia Mire    An incentive spirometer is a tool that can help keep your lungs clear and active. This tool measures how well you are filling your lungs with each breath. Taking long deep breaths may help reverse or decrease the chance of developing breathing (pulmonary) problems (especially infection) following: A long period of time when you are unable to move or be active. BEFORE THE PROCEDURE  If the spirometer includes an indicator to show your best effort, your nurse or respiratory therapist will set it to a desired goal. If possible, sit up straight or lean slightly forward. Try not to slouch. Hold the incentive spirometer in an upright position. INSTRUCTIONS FOR USE  Sit on the edge of your bed if possible, or sit up as far as you can in bed or on a chair. Hold the incentive spirometer in an upright position. Breathe out normally. Place the mouthpiece in your mouth and seal your lips tightly  around it. Breathe in slowly and as deeply as possible, raising the piston or the ball toward the top of the column. Hold your breath for 3-5 seconds or for as long as possible. Allow the piston or ball to fall to the bottom of the column. Remove the mouthpiece from your mouth and breathe out normally. Rest for a few seconds and repeat Steps 1 through 7 at least 10 times every 1-2 hours when you are awake. Take your time and take a few normal breaths between deep breaths. The spirometer may include an indicator to show your best effort. Use the indicator as a goal to work toward during each repetition. After each set of 10 deep breaths, practice coughing to be sure your lungs are clear. If you have an incision (the cut made at the time of surgery), support your incision when coughing by placing a pillow or rolled up towels firmly  against it. Once you are able to get out of bed, walk around indoors and cough well. You may stop using the incentive spirometer when instructed by your caregiver.  RISKS AND COMPLICATIONS Take your time so you do not get dizzy or light-headed. If you are in pain, you may need to take or ask for pain medication before doing incentive spirometry. It is harder to take a deep breath if you are having pain. AFTER USE Rest and breathe slowly and easily. It can be helpful to keep track of a log of your progress. Your caregiver can provide you with a simple table to help with this. If you are using the spirometer at home, follow these instructions: SEEK MEDICAL CARE IF:  You are having difficultly using the spirometer. You have trouble using the spirometer as often as instructed. Your pain medication is not giving enough relief while using the spirometer. You develop fever of 100.5 F (38.1 C) or higher.                                                                                                    SEEK IMMEDIATE MEDICAL CARE IF:  You cough up bloody sputum that had not  been present before. You develop fever of 102 F (38.9 C) or greater. You develop worsening pain at or near the incision site. MAKE SURE YOU:  Understand these instructions. Will watch your condition. Will get help right away if you are not doing well or get worse. Document Released: 06/08/2006 Document Revised: 04/20/2011 Document Reviewed: 08/09/2006 [facility] Patient Information 2014 [place], [place].

## 2023-03-10 ENCOUNTER — Other Ambulatory Visit: Payer: Self-pay

## 2023-03-10 ENCOUNTER — Encounter (HOSPITAL_COMMUNITY): Payer: Self-pay

## 2023-03-10 ENCOUNTER — Encounter (HOSPITAL_COMMUNITY)
Admission: RE | Admit: 2023-03-10 | Discharge: 2023-03-10 | Disposition: A | Discharge status: Home / Self Care | Payer: PPO | Source: Ambulatory Visit | Attending: Orthopedic Surgery | Admitting: Orthopedic Surgery

## 2023-03-10 VITALS — BP 126/69 | HR 82 | Temp 98.1°F | Resp 16 | Ht 66.0 in | Wt 140.0 lb

## 2023-03-10 DIAGNOSIS — M7062 Trochanteric bursitis, left hip: Secondary | ICD-10-CM | POA: Diagnosis not present

## 2023-03-10 DIAGNOSIS — Z01818 Encounter for other preprocedural examination: Secondary | ICD-10-CM

## 2023-03-10 DIAGNOSIS — I10 Essential (primary) hypertension: Secondary | ICD-10-CM | POA: Diagnosis not present

## 2023-03-10 DIAGNOSIS — Z01812 Encounter for preprocedural laboratory examination: Secondary | ICD-10-CM | POA: Insufficient documentation

## 2023-03-10 LAB — BASIC METABOLIC PANEL
Anion gap: 12 (ref 5–15)
BUN: 12 mg/dL (ref 8–23)
CO2: 27 mmol/L (ref 22–32)
Calcium: 9.8 mg/dL (ref 8.9–10.3)
Chloride: 102 mmol/L (ref 98–111)
Creatinine, Ser: 0.78 mg/dL (ref 0.44–1.00)
GFR, Estimated: >60 mL/min (ref >60)
Glucose, Bld: 90 mg/dL (ref 70–99)
Potassium: 4 mmol/L (ref 3.5–5.1)
Sodium: 141 mmol/L (ref 135–145)

## 2023-03-10 LAB — CBC
HCT: 44.3 % (ref 36.0–46.0)
Hemoglobin: 14 g/dL (ref 12.0–15.0)
MCH: 30.2 pg (ref 26.0–34.0)
MCHC: 31.6 g/dL (ref 30.0–36.0)
MCV: 95.7 fL (ref 80.0–100.0)
Platelets: 305 10*3/uL (ref 150–400)
RBC: 4.63 MIL/uL (ref 3.87–5.11)
RDW: 14.2 % (ref 11.5–15.5)
WBC: 9.7 10*3/uL (ref 4.0–10.5)
nRBC: 0 % (ref 0.0–0.2)

## 2023-03-11 ENCOUNTER — Encounter (HOSPITAL_COMMUNITY)
Admission: RE | Admit: 2023-03-11 | Discharge: 2023-03-11 | Disposition: A | Discharge status: Home / Self Care | Payer: PPO | Source: Ambulatory Visit | Attending: Orthopedic Surgery | Admitting: Orthopedic Surgery

## 2023-03-11 DIAGNOSIS — Z01818 Encounter for other preprocedural examination: Secondary | ICD-10-CM

## 2023-03-11 DIAGNOSIS — Z01812 Encounter for preprocedural laboratory examination: Secondary | ICD-10-CM | POA: Diagnosis not present

## 2023-03-11 LAB — SURGICAL PCR SCREEN
MRSA, PCR: NEGATIVE
Staphylococcus aureus: NEGATIVE

## 2023-03-15 ENCOUNTER — Encounter (HOSPITAL_COMMUNITY): Admission: RE | Payer: Self-pay | Source: Home / Self Care

## 2023-03-15 ENCOUNTER — Ambulatory Visit (HOSPITAL_COMMUNITY): Admission: RE | Admit: 2023-03-15 | Payer: PPO | Source: Home / Self Care | Admitting: Orthopedic Surgery

## 2023-03-15 SURGERY — EXCISION/RELEASE BURSA HIP
Anesthesia: Choice | Site: Hip | Laterality: Left

## 2023-03-15 NOTE — Progress Notes (Signed)
Patient surgery is cancelled today because patient called and to tell us she has a cough and has had some vomiting, instructed patient to call Dr Salina April office to be rescheduled

## 2023-03-16 DIAGNOSIS — I7300 Raynaud's syndrome without gangrene: Secondary | ICD-10-CM | POA: Diagnosis not present

## 2023-03-16 DIAGNOSIS — M810 Age-related osteoporosis without current pathological fracture: Secondary | ICD-10-CM | POA: Diagnosis not present

## 2023-03-16 DIAGNOSIS — R748 Abnormal levels of other serum enzymes: Secondary | ICD-10-CM | POA: Diagnosis not present

## 2023-03-16 DIAGNOSIS — Z79899 Other long term (current) drug therapy: Secondary | ICD-10-CM | POA: Diagnosis not present

## 2023-03-16 DIAGNOSIS — M609 Myositis, unspecified: Secondary | ICD-10-CM | POA: Diagnosis not present

## 2023-03-16 DIAGNOSIS — M3390 Dermatopolymyositis, unspecified, organ involvement unspecified: Secondary | ICD-10-CM | POA: Diagnosis not present

## 2023-03-16 DIAGNOSIS — M359 Systemic involvement of connective tissue, unspecified: Secondary | ICD-10-CM | POA: Diagnosis not present

## 2023-03-16 DIAGNOSIS — M1990 Unspecified osteoarthritis, unspecified site: Secondary | ICD-10-CM | POA: Diagnosis not present

## 2023-03-22 NOTE — H&P (Signed)
 H&P  Subjective:  Chief Complaint: Left hip pain  HPI: Sheryl Hamilton, 73 y.o. female, presents today for surgical treatment of a left gluteal tendon tear. She reports that the pain is located on the outer side of her left hip and has been present for approximately seven and a half months. She previously saw Dr. Phares Brasher, who administered an injection in her back, suspecting the sacroiliac joint (SI) as the source of pain, but it only provided relief for a week. She describes the pain as throbbing and sharp, which worsens at night, making it difficult for her to sleep. The pain also intensifies when she carries out activities such as walking or riding. Sheryl Hamilton has undergone physical therapy and received injections on the side of her hip, which did not alleviate the pain. She had a previous repair in June, but the pain persisted, and a new tear was identified adjacent to the previous repair site  Patient Active Problem List   Diagnosis Date Noted   Trochanteric bursitis of left hip 07/13/2022   Primary osteoarthritis of right knee 09/04/2019   OA (osteoarthritis) of knee 06/11/2014   Cough 10/17/2012   HBP (high blood pressure) 10/17/2012   Precordial pain 10/14/2012    Past Medical History:  Diagnosis Date   Allergy    seasonal   Arthritis    Complication of anesthesia    Hypertension    Hypothyroid    Lupus    PONV (postoperative nausea and vomiting)     Past Surgical History:  Procedure Laterality Date   APPENDECTOMY     EXCISION/RELEASE BURSA HIP Left 07/13/2022   Procedure: EXCISION/RELEASE BURSA HIP;  Surgeon: Liliane Rei, MD;  Location: [facility];  Service: Orthopedics;  Laterality: Left;   EYE SURGERY Bilateral 2020   cataract extraction   NECK SURGERY  1997   Pinched Nerve, ACDF? at [facility]  by Dr. Boston Byers   OPEN SURGICAL REPAIR OF GLUTEAL TENDON Left 07/13/2022   Procedure: Left hip bursectomy; gluteal tendon repair;  Surgeon: Liliane Rei, MD;  Location: [facility];   Service: Orthopedics;  Laterality: Left;   TONSILLECTOMY     TOTAL KNEE ARTHROPLASTY Left 06/11/2014   Procedure: LEFT TOTAL KNEE ARTHROPLASTY;  Surgeon: Liliane Rei, MD;  Location: [facility];  Service: Orthopedics;  Laterality: Left;   TOTAL KNEE ARTHROPLASTY Right 09/04/2019   Procedure: TOTAL KNEE ARTHROPLASTY;  Surgeon: Liliane Rei, MD;  Location: [facility];  Service: Orthopedics;  Laterality: Right;   VAGINAL HYSTERECTOMY      Prior to Admission medications   Medication Sig Start Date End Date Taking? Authorizing Provider  aspirin  EC 81 MG tablet Take 81 mg by mouth in the morning. Swallow whole.    [provider]  Cholecalciferol (VITAMIN D) 50 MCG (2000 UT) CAPS Take 2,000 Units by mouth in the morning.    [provider]  ciprofloxacin (CILOXAN) 0.3 % ophthalmic solution Place 1 drop into both eyes See admin instructions. Instill 1 drop into the left eye 4 times daily for 2 days after each monthly injection    [provider]  diphenhydramine -acetaminophen  (TYLENOL  PM) 25-500 MG TABS tablet Take 1 tablet by mouth at bedtime as needed (hip pain/sleep).    [provider]  hydrochlorothiazide (HYDRODIURIL) 12.5 MG tablet Take 12.5 mg by mouth in the morning.    [provider]  levothyroxine  (SYNTHROID ) 50 MCG tablet Take 50 mcg by mouth every other day.    [provider]  levothyroxine  (SYNTHROID , LEVOTHROID) 75  MCG tablet Take 75 mcg by mouth every other day.    [provider]  mycophenolate (CELLCEPT) 500 MG tablet Take 1,000 mg by mouth 2 (two) times daily. 08/10/19   [provider]  PARoxetine  (PAXIL ) 20 MG tablet Take 20 mg by mouth daily.    [provider]  zolpidem (AMBIEN) 5 MG tablet Take 2.5 mg by mouth at bedtime as needed for sleep. 10/14/22   [provider]    No Known Allergies  Social History   Socioeconomic History   Marital status: Divorced    Spouse name: Not on file    Number of children: 1   Years of education: Not on file   Highest education level: Not on file  Occupational History   Occupation: [employer]    Comment: Clerical work   Tobacco Use   Smoking status: Never   Smokeless tobacco: Never  Vaping Use   Vaping status: Never Used  Substance and Sexual Activity   Alcohol  use: Not Currently    Comment: occasionally   Drug use: No   Sexual activity: Not Currently  Other Topics Concern   Not on file  Social History Narrative   Lives alone.    Social Drivers of Corporate investment banker Strain: Low Risk  (07/15/2020)   Received from [facility], [facility]   Overall Financial Resource Strain (CARDIA)    Difficulty of Paying Living Expenses: Not hard at all  Food Insecurity: No Food Insecurity (07/15/2020)   Received from [facility], [facility]   Hunger Vital Sign    Worried About Running Out of Food in the Last Year: Never true    Ran Out of Food in the Last Year: Never true  Transportation Needs: No Transportation Needs (07/15/2020)   [facility], [facility]   [facility] - Transportation    Lack of Transportation (Medical): No    Lack of Transportation (Non-Medical): No  Physical Activity: Not on file  Stress: Not on file  Social Connections: Not on file  Intimate Partner Violence: Not on file    Tobacco Use: Low Risk  (03/10/2023)   Patient History    Smoking Tobacco Use: Never    Smokeless Tobacco Use: Never    Passive Exposure: Not on file   Social History   Substance and Sexual Activity  Alcohol  Use Not Currently   Comment: occasionally    Family History  Problem Relation Age of Onset   Cancer Sister        Ovarian   Cancer Mother        Breast   Diabetes Sister    Irritable bowel syndrome Sister    Esophageal cancer Neg Hx    Stomach cancer Neg Hx     Review of Systems  Constitutional:  Negative for chills and fever.  HENT:  Negative for congestion, sore throat and  tinnitus.   Eyes:  Negative for double vision, photophobia and pain.  Respiratory:  Negative for cough, shortness of breath and wheezing.   Cardiovascular:  Negative for chest pain, palpitations and orthopnea.  Gastrointestinal:  Negative for heartburn, nausea and vomiting.  Genitourinary:  Negative for dysuria, frequency and urgency.  Musculoskeletal:  Positive for joint pain.  Neurological:  Negative for dizziness, weakness and headaches.     Objective:  Physical Exam: Well-developed female, alert, oriented, no apparent distress. - Evaluation of her left hip shows flexion to 120 degrees, rotation in  30 degrees, out 40 degrees, and abduction 40 degrees without pain in range of motion. - Exquisitely tender over the greater trochanter, palpation reproduces the pain she is experiencing. - Gait pattern is slightly antalgic on the left  Imaging Review MRI scan of the left hip demonstrates a new tear in the gluteus medius, anterior fibers. The area of the previous repair is intact.  Assessment/Plan:  ASSESSMENT: Sheryl Hamilton has left hip pain due to a new gluteus medius tendon tear. The tear is very symptomatic, and she has experienced symptoms for multiple months without relief from injections, physical therapy, or a lumbar injection. The new tear is adjacent to the previous repair site, which remains intact.  PLAN: We discussed performing another gluteal tendon repair, and Sheryl Hamilton is in favor of proceeding with the surgery.   Sharlynn Dear, PA-C Orthopedic Surgery [facility] [place]

## 2023-03-22 NOTE — Progress Notes (Signed)
 Patient phoned to give updated information on surgery.  Surgery was rescheduled on 03-15-23 due to illness.  Patient states that all symptoms have resolved and she is doing well.    Date of Surgery - 03-29-23  Arrival Time - 2:45 PM and check in at admitting.    NPO Status - patient reminded to not eat solid food after midnight.  From midnight until 2:00 PM may have clear liquids  Medications morning of surgery - Levothyroxine , Mycophenolate, Paroxetine   No change in medical history, allergies per patient.  Transportation home - [place] [phone]   All questions answered and patient stated understanding

## 2023-03-29 ENCOUNTER — Encounter (HOSPITAL_COMMUNITY): Payer: Self-pay | Admitting: Orthopedic Surgery

## 2023-03-29 ENCOUNTER — Ambulatory Visit (HOSPITAL_BASED_OUTPATIENT_CLINIC_OR_DEPARTMENT_OTHER): Payer: PPO | Admitting: Anesthesiology

## 2023-03-29 ENCOUNTER — Ambulatory Visit (HOSPITAL_COMMUNITY)
Admission: RE | Admit: 2023-03-29 | Discharge: 2023-03-29 | Disposition: A | Discharge status: Home / Self Care | Payer: PPO | Attending: Orthopedic Surgery | Admitting: Orthopedic Surgery

## 2023-03-29 ENCOUNTER — Ambulatory Visit (HOSPITAL_COMMUNITY): Payer: PPO | Admitting: Anesthesiology

## 2023-03-29 ENCOUNTER — Other Ambulatory Visit: Payer: Self-pay

## 2023-03-29 ENCOUNTER — Encounter (HOSPITAL_COMMUNITY)
Admission: RE | Disposition: A | Discharge status: Home / Self Care | Payer: Self-pay | Source: Home / Self Care | Attending: Orthopedic Surgery

## 2023-03-29 DIAGNOSIS — M1990 Unspecified osteoarthritis, unspecified site: Secondary | ICD-10-CM | POA: Diagnosis not present

## 2023-03-29 DIAGNOSIS — S76012A Strain of muscle, fascia and tendon of left hip, initial encounter: Secondary | ICD-10-CM | POA: Diagnosis not present

## 2023-03-29 DIAGNOSIS — M7062 Trochanteric bursitis, left hip: Secondary | ICD-10-CM

## 2023-03-29 DIAGNOSIS — X58XXXA Exposure to other specified factors, initial encounter: Secondary | ICD-10-CM | POA: Diagnosis not present

## 2023-03-29 DIAGNOSIS — E039 Hypothyroidism, unspecified: Secondary | ICD-10-CM

## 2023-03-29 DIAGNOSIS — I10 Essential (primary) hypertension: Secondary | ICD-10-CM

## 2023-03-29 HISTORY — PX: EXCISION/RELEASE BURSA HIP: SHX5014

## 2023-03-29 HISTORY — PX: OPEN SURGICAL REPAIR OF GLUTEAL TENDON: SHX5995

## 2023-03-29 SURGERY — RELEASE, BURSA, TROCHANTERIC
Anesthesia: General | Site: Hip | Laterality: Left

## 2023-03-29 MED ORDER — ONDANSETRON HCL 4 MG/2ML IJ SOLN
INTRAMUSCULAR | Status: DC | PRN
  Administered 2023-03-29: 4 mg via INTRAVENOUS

## 2023-03-29 MED ORDER — 0.9 % SODIUM CHLORIDE (POUR BTL) OPTIME
TOPICAL | Status: DC | PRN
  Administered 2023-03-29: 1000 mL

## 2023-03-29 MED ORDER — DEXAMETHASONE SODIUM PHOSPHATE 4 MG/ML IJ SOLN
INTRAMUSCULAR | Status: DC | PRN
  Administered 2023-03-29: 8 mg via INTRAVENOUS

## 2023-03-29 MED ORDER — DEXAMETHASONE SODIUM PHOSPHATE 10 MG/ML IJ SOLN
INTRAMUSCULAR | Status: AC
Start: 2023-03-29 — End: ?
  Filled 2023-03-29: qty 1

## 2023-03-29 MED ORDER — OXYCODONE HCL 5 MG/5ML PO SOLN: 5.0000 mg | Freq: Once | ORAL | Status: DC | PRN

## 2023-03-29 MED ORDER — PROPOFOL 10 MG/ML IV BOLUS
INTRAVENOUS | Status: AC
  Filled 2023-03-29: qty 20

## 2023-03-29 MED ORDER — FENTANYL CITRATE PF 50 MCG/ML IJ SOSY
PREFILLED_SYRINGE | INTRAMUSCULAR | Status: AC
  Filled 2023-03-29: qty 1

## 2023-03-29 MED ORDER — ONDANSETRON HCL 4 MG/2ML IJ SOLN
INTRAMUSCULAR | Status: AC
  Filled 2023-03-29: qty 2

## 2023-03-29 MED ORDER — ACETAMINOPHEN 500 MG PO TABS
1000.0000 mg | ORAL_TABLET | Freq: Once | ORAL | Status: AC
  Administered 2023-03-29: 1000 mg via ORAL
  Filled 2023-03-29: qty 2

## 2023-03-29 MED ORDER — METHOCARBAMOL 500 MG PO TABS: 500.0000 mg | ORAL_TABLET | Freq: Four times a day (QID) | ORAL | 0 refills | Status: AC | PRN

## 2023-03-29 MED ORDER — LACTATED RINGERS IV SOLN: INTRAVENOUS | Status: DC | PRN

## 2023-03-29 MED ORDER — PHENYLEPHRINE HCL (PRESSORS) 10 MG/ML IV SOLN
INTRAVENOUS | Status: DC | PRN
  Administered 2023-03-29 (×6): 160 ug via INTRAVENOUS

## 2023-03-29 MED ORDER — CEFAZOLIN SODIUM-DEXTROSE 2-4 GM/100ML-% IV SOLN
2.0000 g | Freq: Once | INTRAVENOUS | Status: AC
  Administered 2023-03-29: 2 g via INTRAVENOUS
  Filled 2023-03-29: qty 100

## 2023-03-29 MED ORDER — LACTATED RINGERS IV SOLN: INTRAVENOUS | Status: DC

## 2023-03-29 MED ORDER — FENTANYL CITRATE PF 50 MCG/ML IJ SOSY
25.0000 ug | PREFILLED_SYRINGE | INTRAMUSCULAR | Status: DC | PRN
Start: 2023-03-29 — End: 2023-03-29
  Administered 2023-03-29 (×2): 50 ug via INTRAVENOUS

## 2023-03-29 MED ORDER — BUPIVACAINE-EPINEPHRINE 0.25% -1:200000 IJ SOLN
INTRAMUSCULAR | Status: AC
  Filled 2023-03-29: qty 1

## 2023-03-29 MED ORDER — BUPIVACAINE-EPINEPHRINE (PF) 0.25% -1:200000 IJ SOLN
INTRAMUSCULAR | Status: DC | PRN
  Administered 2023-03-29: 30 mL via PERINEURAL

## 2023-03-29 MED ORDER — HYDROCODONE-ACETAMINOPHEN 5-325 MG PO TABS: 1.0000 | ORAL_TABLET | Freq: Four times a day (QID) | ORAL | 0 refills | Status: AC | PRN

## 2023-03-29 MED ORDER — PHENYLEPHRINE 80 MCG/ML (10ML) SYRINGE FOR IV PUSH (FOR BLOOD PRESSURE SUPPORT)
PREFILLED_SYRINGE | INTRAVENOUS | Status: DC | PRN
  Administered 2023-03-29: 160 ug via INTRAVENOUS

## 2023-03-29 MED ORDER — LIDOCAINE HCL (CARDIAC) PF 100 MG/5ML IV SOSY
PREFILLED_SYRINGE | INTRAVENOUS | Status: DC | PRN
  Administered 2023-03-29: 60 mg via INTRAVENOUS

## 2023-03-29 MED ORDER — FENTANYL CITRATE (PF) 100 MCG/2ML IJ SOLN
INTRAMUSCULAR | Status: DC | PRN
  Administered 2023-03-29: 25 ug via INTRAVENOUS
  Administered 2023-03-29: 50 ug via INTRAVENOUS
  Administered 2023-03-29: 25 ug via INTRAVENOUS

## 2023-03-29 MED ORDER — CHLORHEXIDINE GLUCONATE 0.12 % MT SOLN
15.0000 mL | Freq: Once | OROMUCOSAL | Status: AC
  Administered 2023-03-29: 15 mL via OROMUCOSAL

## 2023-03-29 MED ORDER — LIDOCAINE HCL (PF) 2 % IJ SOLN
INTRAMUSCULAR | Status: AC
  Filled 2023-03-29: qty 5

## 2023-03-29 MED ORDER — FENTANYL CITRATE (PF) 100 MCG/2ML IJ SOLN: INTRAMUSCULAR | Status: DC | PRN

## 2023-03-29 MED ORDER — HYDROMORPHONE HCL 1 MG/ML IJ SOLN
0.2500 mg | INTRAMUSCULAR | Status: DC | PRN
  Administered 2023-03-29 (×2): 0.25 mg via INTRAVENOUS

## 2023-03-29 MED ORDER — FENTANYL CITRATE PF 50 MCG/ML IJ SOSY
PREFILLED_SYRINGE | INTRAMUSCULAR | Status: DC
Start: 2023-03-29 — End: 2023-03-29
  Filled 2023-03-29: qty 1

## 2023-03-29 MED ORDER — OXYCODONE HCL 5 MG PO TABS: 5.0000 mg | ORAL_TABLET | Freq: Once | ORAL | Status: DC | PRN

## 2023-03-29 MED ORDER — ACETAMINOPHEN 10 MG/ML IV SOLN: 1000.0000 mg | Freq: Once | INTRAVENOUS | Status: DC | PRN

## 2023-03-29 MED ORDER — FENTANYL CITRATE (PF) 100 MCG/2ML IJ SOLN
INTRAMUSCULAR | Status: AC
  Filled 2023-03-29: qty 2

## 2023-03-29 MED ORDER — DROPERIDOL 2.5 MG/ML IJ SOLN: 0.6250 mg | Freq: Once | INTRAMUSCULAR | Status: DC | PRN

## 2023-03-29 MED ORDER — HYDROMORPHONE HCL 1 MG/ML IJ SOLN
INTRAMUSCULAR | Status: AC
  Filled 2023-03-29: qty 1

## 2023-03-29 MED ORDER — PROPOFOL 10 MG/ML IV BOLUS
INTRAVENOUS | Status: DC | PRN
  Administered 2023-03-29: 150 mg via INTRAVENOUS

## 2023-03-29 SURGICAL SUPPLY — 50 items
ANCHOR SUT BIO SW 4.75X19.1 (Anchor) IMPLANT
BAG COUNTER SPONGE SURGICOUNT (BAG) IMPLANT
BAG ZIPLOCK 12X15 (MISCELLANEOUS) IMPLANT
BIT DRILL 2.4X128 (BIT) IMPLANT
BIT DRILL 2.8X128 (BIT) IMPLANT
BLADE EXTENDED COATED 6.5IN (ELECTRODE) IMPLANT
COVER SURGICAL LIGHT HANDLE (MISCELLANEOUS) ×1 IMPLANT
DERMABOND ADVANCED .7 DNX12 (GAUZE/BANDAGES/DRESSINGS) ×1 IMPLANT
DRAPE INCISE IOBAN 66X45 STRL (DRAPES) ×1 IMPLANT
DRAPE POUCH INSTRU U-SHP 10X18 (DRAPES) ×1 IMPLANT
DRAPE SURG ORHT 6 SPLT 77X108 (DRAPES) ×2 IMPLANT
DRAPE U-SHAPE 47X51 STRL (DRAPES) ×1 IMPLANT
DRSG AQUACEL AG ADV 3.5X 6 (GAUZE/BANDAGES/DRESSINGS) IMPLANT
DRSG AQUACEL AG ADV 3.5X10 (GAUZE/BANDAGES/DRESSINGS) ×1 IMPLANT
DURAPREP 26ML APPLICATOR (WOUND CARE) ×1 IMPLANT
ELECT REM PT RETURN 15FT ADLT (MISCELLANEOUS) ×1 IMPLANT
EVACUATOR 1/8 PVC DRAIN (DRAIN) IMPLANT
FACESHIELD WRAPAROUND (MASK) ×2 IMPLANT
FACESHIELD WRAPAROUND OR TEAM (MASK) ×2 IMPLANT
GAUZE SPONGE 4X4 12PLY STRL (GAUZE/BANDAGES/DRESSINGS) ×1 IMPLANT
GLOVE BIO SURGEON STRL SZ 6.5 (GLOVE) ×1 IMPLANT
GLOVE BIO SURGEON STRL SZ7.5 (GLOVE) IMPLANT
GLOVE BIO SURGEON STRL SZ8 (GLOVE) ×1 IMPLANT
GLOVE BIOGEL PI IND STRL 6.5 (GLOVE) IMPLANT
GLOVE BIOGEL PI IND STRL 7.0 (GLOVE) ×1 IMPLANT
GLOVE BIOGEL PI IND STRL 8 (GLOVE) ×1 IMPLANT
GOWN STRL REUS W/ TWL LRG LVL3 (GOWN DISPOSABLE) ×2 IMPLANT
KIT BASIN OR (CUSTOM PROCEDURE TRAY) ×1 IMPLANT
KIT TURNOVER KIT A (KITS) IMPLANT
MANIFOLD NEPTUNE II (INSTRUMENTS) ×1 IMPLANT
NDL MA TROC 1/2 (NEEDLE) IMPLANT
NDL SAFETY ECLIPSE 18X1.5 (NEEDLE) ×2 IMPLANT
NDL TAPERED W/ NITINOL LOOP (MISCELLANEOUS) IMPLANT
NEEDLE MA TROC 1/2 (NEEDLE) IMPLANT
NEEDLE TAPERED W/ NITINOL LOOP (MISCELLANEOUS) ×1 IMPLANT
NS IRRIG 1000ML POUR BTL (IV SOLUTION) ×1 IMPLANT
PACK TOTAL JOINT (CUSTOM PROCEDURE TRAY) ×1 IMPLANT
PASSER SUT SWANSON 36MM LOOP (INSTRUMENTS) IMPLANT
PROTECTOR NERVE ULNAR (MISCELLANEOUS) ×1 IMPLANT
STAPLER SKIN PROX WIDE 3.9 (STAPLE) IMPLANT
STRIP CLOSURE SKIN 1/2X4 (GAUZE/BANDAGES/DRESSINGS) ×2 IMPLANT
SUT ETHIBOND NAB CT1 #1 30IN (SUTURE) ×1 IMPLANT
SUT MNCRL AB 4-0 PS2 18 (SUTURE) ×1 IMPLANT
SUT STRATAFIX 0 PDS 27 VIOLET (SUTURE) ×1 IMPLANT
SUT VIC AB 2-0 CT1 TAPERPNT 27 (SUTURE) ×2 IMPLANT
SUTURE STRATFX 0 PDS 27 VIOLET (SUTURE) ×1 IMPLANT
SUTURE TAPE 1.3 40 TPR END (SUTURE) IMPLANT
SUTURETAPE 1.3 40 TPR END (SUTURE) ×2 IMPLANT
SYR 50ML LL SCALE MARK (SYRINGE) ×1 IMPLANT
TOWEL OR 17X26 10 PK STRL BLUE (TOWEL DISPOSABLE) ×2 IMPLANT

## 2023-03-29 NOTE — Transfer of Care (Signed)
 Immediate Anesthesia Transfer of Care Note  Patient: Sheryl Hamilton  Procedure(s) Performed: Left hip bursectomy; gluteal tendon repair (Left: Hip) OPEN REPAIR OF GLUTEAL TENDON (Left: Hip)  Patient Location: PACU  Anesthesia Type:General  Level of Consciousness: awake and alert   Airway & Oxygen Therapy: Patient Spontanous Breathing and Patient connected to face mask oxygen  Post-op Assessment: Report given to RN and Post -op Vital signs reviewed and stable  Post vital signs: Reviewed and stable  Last Vitals:  Vitals Value Taken Time  BP 153/80 03/29/23 1410  Temp    Pulse 108 03/29/23 1411  Resp 13 03/29/23 1411  SpO2 100 % 03/29/23 1411  Vitals shown include unfiled device data.  Last Pain:  Vitals:   03/29/23 1108  TempSrc:   PainSc: 7          Complications: No notable events documented.

## 2023-03-29 NOTE — Anesthesia Procedure Notes (Signed)
 Procedure Name: LMA Insertion Date/Time: 03/29/2023 12:56 PM  Performed by: Jamelle Rushing, CRNAPre-anesthesia Checklist: Patient identified, Emergency Drugs available, Suction available, Patient being monitored and Timeout performed Patient Re-evaluated:Patient Re-evaluated prior to induction Oxygen Delivery Method: Circle system utilized Preoxygenation: Pre-oxygenation with 100% oxygen Induction Type: IV induction LMA: LMA inserted LMA Size: 4.0 Number of attempts: 1 Placement Confirmation: positive ETCO2, CO2 detector and breath sounds checked- equal and bilateral Tube secured with: Tape Dental Injury: Teeth and Oropharynx as per pre-operative assessment

## 2023-03-29 NOTE — Anesthesia Preprocedure Evaluation (Signed)
 Anesthesia Evaluation  Patient identified by MRN, date of birth, ID band Patient awake    Reviewed: Allergy & Precautions, NPO status , Patient's Chart, lab work & pertinent test results  History of Anesthesia Complications (+) PONV and history of anesthetic complications  Airway Mallampati: II  TM Distance: >3 FB Neck ROM: Full    Dental no notable dental hx. (+) Teeth Intact, Dental Advisory Given, Implants   Pulmonary    Pulmonary exam normal breath sounds clear to auscultation       Cardiovascular hypertension, Normal cardiovascular exam Rhythm:Regular Rate:Normal     Neuro/Psych negative neurological ROS  negative psych ROS   GI/Hepatic   Endo/Other  Hypothyroidism    Renal/GU negative Renal ROS     Musculoskeletal  (+) Arthritis ,    Abdominal   Peds  Hematology   Anesthesia Other Findings   Reproductive/Obstetrics                              Anesthesia Physical Anesthesia Plan  ASA: 3  Anesthesia Plan: General   Post-op Pain Management: Tylenol PO (pre-op)*   Induction: Intravenous  PONV Risk Score and Plan: 4 or greater and Treatment may vary due to age or medical condition, Ondansetron and Dexamethasone  Airway Management Planned: LMA  Additional Equipment: None  Intra-op Plan:   Post-operative Plan: Extubation in OR  Informed Consent: I have reviewed the patients History and Physical, chart, labs and discussed the procedure including the risks, benefits and alternatives for the proposed anesthesia with the patient or authorized representative who has indicated his/her understanding and acceptance.     Dental advisory given  Plan Discussed with: CRNA  Anesthesia Plan Comments:          Anesthesia Quick Evaluation

## 2023-03-29 NOTE — Discharge Instructions (Addendum)
Ollen Gross, MD [facility] [place] [address], [address] [place], [place] [address] [phone]  BURSECTOMY / GLUTEAL TENDON REPAIR POSTOPERATIVE INSTRUCTIONS  HOME CARE INSTRUCTIONS  . Remove items at home which could result in a fall. This includes throw rugs or furniture in walking pathways.   ICE to the affected hip every three hours for 30 minutes at a time and then as needed for pain and swelling. Continue to use ice on the hip for pain and swelling from surgery. You may notice swelling that will progress down to the foot and ankle. This is normal after surgery. Elevate the leg when you are not up walking on it.    Continue to use the breathing machine which will help keep your temperature down. It is common for your temperature to cycle up and down following surgery, especially at night when you are not up moving around and exerting yourself. The breathing machine keeps your lungs expanded and your temperature down. . Sit on high chairs which makes it easier to stand.  . Sit on chairs with arms. Use the chair arms to help push yourself up when arising.   No active abduction of the leg (do not pull leg out to the side away from the body).  Use your walker for first several days until comfortable ambulating.  DIET You may resume your previous home diet once you are discharged from the hospital.  DRESSING / WOUND CARE / SHOWERING . You will have an adhesive waterproof bandage over the incision. Leave this in place until your first follow-up appointment. . You may shower three days after surgery, but do not submerge the incision under water.   ACTIVITY . Walk with your walker as instructed. Marland Kitchen Use walker as long as suggested by your caregivers. . Avoid periods of inactivity such as sitting longer than an hour when not asleep. This helps prevent blood clots.  . You may resume a sexual relationship in one month or when given the OK by your  doctor.  . You may return to work once you are cleared by your doctor.  . Do not drive a car for 6 weeks or until released by you surgeon.  . Do not drive while taking narcotics.  WEIGHT BEARING Weight bearing as tolerated with assist device (walker, cane, etc) as directed, use it until comfortable ambulating.  POSTOPERATIVE CONSTIPATION PROTOCOL Constipation - defined medically as fewer than three stools per week and severe constipation as less than one stool per week.  One of the most common issues patients have following surgery is constipation.  Even if you have a regular bowel pattern at home, your normal regimen is likely to be disrupted due to multiple reasons following surgery.  Combination of anesthesia, postoperative narcotics, change in appetite and fluid intake all can affect your bowels.  In order to avoid complications following surgery, here are some recommendations in order to help you during your recovery period.  Colace (docusate) - Pick up an over-the-counter form of Colace or another stool softener and take twice a day as long as you are requiring postoperative pain medications.  Take with a full glass of water daily.  If you experience loose stools or diarrhea, hold the colace until you stool forms back up.  If your symptoms do not get better within 1 week or if they get worse, check with your doctor.  Dulcolax (bisacodyl) - Pick up over-the-counter and take as directed by the product packaging  as needed to assist with the movement of your bowels.  Take with a full glass of water.  Use this product as needed if not relieved by Colace only.   MiraLax (polyethylene glycol) - Pick up over-the-counter to have on hand.  MiraLax is a solution that will increase the amount of water in your bowels to assist with bowel movements.  Take as directed and can mix with a glass of water, juice, soda, coffee, or tea.  Take if you go more than two days without a movement. Do not use MiraLax  more than once per day. Call your doctor if you are still constipated or irregular after using this medication for 7 days in a row.  If you continue to have problems with postoperative constipation, please contact the office for further assistance and recommendations.  If you experience "the worst abdominal pain ever" or develop nausea or vomiting, please contact the office immediatly for further recommendations for treatment.  ITCHING  If you experience itching with your medications, try taking only a single pain pill, or even half a pain pill at a time.  You can also use Benadryl over the counter for itching or also to help with sleep.   TED HOSE STOCKINGS Wear the elastic stockings on both legs for three weeks following surgery during the day but you may remove then at night for sleeping.  MEDICATIONS See your medication summary on the "After Visit Summary" that the nursing staff will review with you prior to discharge.  You may have some home medications which will be placed on hold until you complete the course of blood thinner medication.  It is important for you to complete the blood thinner medication as prescribed by your surgeon.  Continue your approved medications as instructed at time of discharge.  PRECAUTIONS If you experience chest pain or shortness of breath - call 911 immediately for transfer to the hospital emergency department.  If you develop a fever greater that 101 F, purulent drainage from wound, increased redness or drainage from wound, foul odor from the wound/dressing, or calf pain - CONTACT YOUR SURGEON.                                                   FOLLOW-UP APPOINTMENTS Make sure you keep all of your appointments after your operation with your surgeon and caregivers. You should call the office at the above phone number and make an appointment for approximately two weeks after the date of your surgery or on the date instructed by your surgeon outlined in the "After  Visit Summary".   Pick up stool softner and laxative for home use following surgery while on pain medications. Do not submerge incision under water. Please use good hand washing techniques while changing dressing each day. May shower starting three days after surgery. Please use a clean towel to pat the incision dry following showers. Continue to use ice for pain and swelling after surgery. Do not use any lotions or creams on the incision until instructed by your surgeon.

## 2023-03-29 NOTE — Op Note (Unsigned)
 Sheryl Hamilton, SIMEONE MEDICAL RECORD NO: [id number] ACCOUNT NO: [id number] DATE OF BIRTH: [birth year] FACILITY: [facility] LOCATION: [facility] PHYSICIAN: Gus Rankin. Pammy Vesey, MD  Operative Report   DATE OF PROCEDURE: 03/29/2023  PREOPERATIVE DIAGNOSIS:  Left hip, recurrent gluteal tendon tear.  POSTOPERATIVE DIAGNOSIS:  Left hip, recurrent gluteal tendon tear.  PROCEDURE PERFORMED:  Left hip repair of recurrent gluteus medius tear.  SURGEON:  Gus Rankin. Krissa Utke, MD  ASSISTANT:  Julieta Gutting, PA-C  ANESTHESIA:  General.  ESTIMATED BLOOD LOSS:  10 mL.  DRAINS:  None.  COMPLICATIONS:  None.  CONDITION:  Stable to recovery.  BRIEF CLINICAL NOTE:  The patient is a 73 year old female who has significant persistent left lateral hip pain.  She had a repair of the gluteus medius tendon last June and did very well initially and then a few months later developed increasing pain in  the lateral hip.  Nonoperative modalities including injection and therapy did not help.  An MRI scan showed that she had a gluteus medius tear adjacent and anterior to her previous tear.  Given failure of nonoperative management, she presents now for  repair of the gluteus medius tendon tear.  DESCRIPTION OF PROCEDURE:  After successful administration of general anesthetic, the patient was placed in a right lateral decubitus position with the left side up and held with a hip positioner.  Left lower extremity was isolated from her perineum  plastic drapes and prepped and draped in the usual sterile fashion.  Her previous lateral-based incisions were utilized.  Skin covered with a 10 blade through the subcutaneous tissue to the level of the fascia lata, which was incised in line with the  skin incision.  There was evidence of a small amount of tissue adjacent to the lateral aspect of the greater trochanter.  There is some mild scarring, but there does not appear to be any recurrent bursal tissue.  Some of the scar tissue is  removed.  The  site of her previous repair appeared intact, but anterior to this and also including some of the fibers from the previous repair, there was a new tear and it is retracted off of the greater trochanter.  I used FiberTapes to suture into the free edge of  those tendons so as to be able to advance them.  I then passed a SwiveLock anchor after punching the bone, we passed the sutures through the SwiveLock anchor and screwed the anchor down into the bone effectively advancing the tendon down onto the greater  trochanter.  We then used a free edge of the sutures to repair the edges of the tendon back to the existing tendon on the greater trochanter.  This was an extremely stable repair and we fully covered up the greater trochanter.  The site was then  irrigated with normal saline.  The fascia lata was closed with a running #1 Stratafix suture.  Subcutaneous was then closed with interrupted 2-0 Vicryl.  Prior to closing that, 30 mL of 0.25% Marcaine with epinephrine were injected into the subcuticular.   Subcutaneous closed with interrupted 2-0 Vicryl and subcuticular running 4-0 Monocryl.  The incision was cleaned and dried and Dermabond applied.  A bulky sterile dressing was applied and she is awakened and transported to recovery in stable condition.   Please note that a surgical assistant was a medical necessity for this procedure to provide adequate exposure to identify and suture and advance and repair the tendon tear.   PUS D: 03/29/2023 2:05:42 pm T: 03/29/2023  2:16:00 pm  JOB: [id number]

## 2023-03-29 NOTE — Anesthesia Postprocedure Evaluation (Signed)
 Anesthesia Post Note  Patient: Sheryl Hamilton  Procedure(s) Performed: Left hip bursectomy; gluteal tendon repair (Left: Hip) OPEN REPAIR OF GLUTEAL TENDON (Left: Hip)     Patient location during evaluation: PACU Anesthesia Type: General Level of consciousness: awake and alert Pain management: pain level controlled Vital Signs Assessment: post-procedure vital signs reviewed and stable Respiratory status: spontaneous breathing, nonlabored ventilation, respiratory function stable and patient connected to nasal cannula oxygen Cardiovascular status: blood pressure returned to baseline and stable Postop Assessment: no apparent nausea or vomiting Anesthetic complications: no   No notable events documented.  Last Vitals:  Vitals:   03/29/23 1415 03/29/23 1430  BP: (!) 147/66 (!) 146/65  Pulse: (!) 106 96  Resp: 10 13  Temp:    SpO2: 100% 99%    Last Pain:  Vitals:   03/29/23 1430  TempSrc:   PainSc: 5                  [place]

## 2023-03-29 NOTE — Interval H&P Note (Signed)
 History and Physical Interval Note:  03/29/2023 10:43 AM  Sheryl Hamilton  has presented today for surgery, with the diagnosis of Left hip bursitis; gluteal tendon tear.  The various methods of treatment have been discussed with the patient and family. After consideration of risks, benefits and other options for treatment, the patient has consented to  Procedure(s) with comments: Left hip bursectomy; gluteal tendon repair (Left) - OPEN REPAIR OF GLUTEAL TENDON (Left) as a surgical intervention.  The patient's history has been reviewed, patient examined, no change in status, stable for surgery.  I have reviewed the patient's chart and labs.  Questions were answered to the patient's satisfaction.     Homero Fellers [name]

## 2023-03-29 NOTE — Brief Op Note (Signed)
 03/29/2023  2:00 PM  PATIENT:  Sheryl Hamilton  73 y.o. female  PRE-OPERATIVE DIAGNOSIS:  Left hip recurrent gluteal tendon tear  POST-OPERATIVE DIAGNOSIS:  Left hip recurrent; gluteal tendon tear  PROCEDURE:  Procedure(s) with comments: Left hip bursectomy; gluteal tendon repair (Left) - OPEN REPAIR OF GLUTEAL TENDON (Left)  SURGEON:  Surgeons and Role:    Ollen Gross, MD - Primary  PHYSICIAN ASSISTANT:   ASSISTANTS: Arther Abbott, PA-C   ANESTHESIA:   general  EBL: 10 ml  BLOOD ADMINISTERED:none  DRAINS: none   LOCAL MEDICATIONS USED:  MARCAINE     COUNTS:  YES  TOURNIQUET:  * No tourniquets in log *  DICTATION: .Other Dictation: Dictation Number [id number]  PLAN OF CARE: Discharge to home after PACU  PATIENT DISPOSITION:  PACU - hemodynamically stable.

## 2023-03-30 ENCOUNTER — Encounter (HOSPITAL_COMMUNITY): Payer: Self-pay | Admitting: Orthopedic Surgery

## 2023-04-06 ENCOUNTER — Encounter (INDEPENDENT_AMBULATORY_CARE_PROVIDER_SITE_OTHER): Payer: PPO | Admitting: Ophthalmology

## 2023-04-09 ENCOUNTER — Encounter (INDEPENDENT_AMBULATORY_CARE_PROVIDER_SITE_OTHER): Payer: PPO | Admitting: Ophthalmology

## 2023-04-13 ENCOUNTER — Encounter (INDEPENDENT_AMBULATORY_CARE_PROVIDER_SITE_OTHER): Payer: PPO | Admitting: Ophthalmology

## 2023-04-13 DIAGNOSIS — H33303 Unspecified retinal break, bilateral: Secondary | ICD-10-CM | POA: Diagnosis not present

## 2023-04-13 DIAGNOSIS — H35033 Hypertensive retinopathy, bilateral: Secondary | ICD-10-CM | POA: Diagnosis not present

## 2023-04-13 DIAGNOSIS — I10 Essential (primary) hypertension: Secondary | ICD-10-CM | POA: Diagnosis not present

## 2023-04-13 DIAGNOSIS — H43813 Vitreous degeneration, bilateral: Secondary | ICD-10-CM

## 2023-04-13 DIAGNOSIS — H34832 Tributary (branch) retinal vein occlusion, left eye, with macular edema: Secondary | ICD-10-CM | POA: Diagnosis not present

## 2023-05-05 DIAGNOSIS — Z1231 Encounter for screening mammogram for malignant neoplasm of breast: Secondary | ICD-10-CM | POA: Diagnosis not present

## 2023-05-11 ENCOUNTER — Encounter (INDEPENDENT_AMBULATORY_CARE_PROVIDER_SITE_OTHER): Admitting: Ophthalmology

## 2023-05-13 ENCOUNTER — Encounter (INDEPENDENT_AMBULATORY_CARE_PROVIDER_SITE_OTHER): Admitting: Ophthalmology

## 2023-05-13 DIAGNOSIS — H43813 Vitreous degeneration, bilateral: Secondary | ICD-10-CM

## 2023-05-13 DIAGNOSIS — H35033 Hypertensive retinopathy, bilateral: Secondary | ICD-10-CM | POA: Diagnosis not present

## 2023-05-13 DIAGNOSIS — I10 Essential (primary) hypertension: Secondary | ICD-10-CM | POA: Diagnosis not present

## 2023-05-13 DIAGNOSIS — H33303 Unspecified retinal break, bilateral: Secondary | ICD-10-CM

## 2023-05-13 DIAGNOSIS — H34832 Tributary (branch) retinal vein occlusion, left eye, with macular edema: Secondary | ICD-10-CM | POA: Diagnosis not present

## 2023-05-18 DIAGNOSIS — E7849 Other hyperlipidemia: Secondary | ICD-10-CM | POA: Diagnosis not present

## 2023-05-18 DIAGNOSIS — I10 Essential (primary) hypertension: Secondary | ICD-10-CM | POA: Diagnosis not present

## 2023-05-18 DIAGNOSIS — Z1321 Encounter for screening for nutritional disorder: Secondary | ICD-10-CM | POA: Diagnosis not present

## 2023-05-18 DIAGNOSIS — E782 Mixed hyperlipidemia: Secondary | ICD-10-CM | POA: Diagnosis not present

## 2023-05-18 DIAGNOSIS — N1831 Chronic kidney disease, stage 3a: Secondary | ICD-10-CM | POA: Diagnosis not present

## 2023-05-18 DIAGNOSIS — E039 Hypothyroidism, unspecified: Secondary | ICD-10-CM | POA: Diagnosis not present

## 2023-05-24 DIAGNOSIS — E039 Hypothyroidism, unspecified: Secondary | ICD-10-CM | POA: Diagnosis not present

## 2023-05-24 DIAGNOSIS — N182 Chronic kidney disease, stage 2 (mild): Secondary | ICD-10-CM | POA: Diagnosis not present

## 2023-05-24 DIAGNOSIS — I10 Essential (primary) hypertension: Secondary | ICD-10-CM | POA: Diagnosis not present

## 2023-05-24 DIAGNOSIS — F339 Major depressive disorder, recurrent, unspecified: Secondary | ICD-10-CM | POA: Diagnosis not present

## 2023-05-24 DIAGNOSIS — Z6823 Body mass index (BMI) 23.0-23.9, adult: Secondary | ICD-10-CM | POA: Diagnosis not present

## 2023-05-31 DIAGNOSIS — S72045A Nondisplaced fracture of base of neck of left femur, initial encounter for closed fracture: Secondary | ICD-10-CM | POA: Diagnosis not present

## 2023-06-07 DIAGNOSIS — I440 Atrioventricular block, first degree: Secondary | ICD-10-CM | POA: Diagnosis not present

## 2023-06-07 DIAGNOSIS — Z7982 Long term (current) use of aspirin: Secondary | ICD-10-CM | POA: Diagnosis not present

## 2023-06-07 DIAGNOSIS — F39 Unspecified mood [affective] disorder: Secondary | ICD-10-CM | POA: Diagnosis not present

## 2023-06-07 DIAGNOSIS — Z23 Encounter for immunization: Secondary | ICD-10-CM | POA: Diagnosis not present

## 2023-06-07 DIAGNOSIS — D649 Anemia, unspecified: Secondary | ICD-10-CM | POA: Diagnosis not present

## 2023-06-07 DIAGNOSIS — Z96653 Presence of artificial knee joint, bilateral: Secondary | ICD-10-CM | POA: Diagnosis not present

## 2023-06-07 DIAGNOSIS — D6489 Other specified anemias: Secondary | ICD-10-CM | POA: Diagnosis not present

## 2023-06-07 DIAGNOSIS — E038 Other specified hypothyroidism: Secondary | ICD-10-CM | POA: Diagnosis not present

## 2023-06-07 DIAGNOSIS — F411 Generalized anxiety disorder: Secondary | ICD-10-CM | POA: Diagnosis not present

## 2023-06-07 DIAGNOSIS — Z90710 Acquired absence of both cervix and uterus: Secondary | ICD-10-CM | POA: Diagnosis not present

## 2023-06-07 DIAGNOSIS — I10 Essential (primary) hypertension: Secondary | ICD-10-CM | POA: Diagnosis not present

## 2023-06-07 DIAGNOSIS — Z01818 Encounter for other preprocedural examination: Secondary | ICD-10-CM | POA: Diagnosis not present

## 2023-06-07 DIAGNOSIS — S7292XA Unspecified fracture of left femur, initial encounter for closed fracture: Secondary | ICD-10-CM | POA: Diagnosis not present

## 2023-06-07 DIAGNOSIS — K922 Gastrointestinal hemorrhage, unspecified: Secondary | ICD-10-CM | POA: Diagnosis not present

## 2023-06-07 DIAGNOSIS — N3000 Acute cystitis without hematuria: Secondary | ICD-10-CM | POA: Diagnosis not present

## 2023-06-07 DIAGNOSIS — Z043 Encounter for examination and observation following other accident: Secondary | ICD-10-CM | POA: Diagnosis not present

## 2023-06-07 DIAGNOSIS — S72012A Unspecified intracapsular fracture of left femur, initial encounter for closed fracture: Secondary | ICD-10-CM | POA: Diagnosis not present

## 2023-06-07 DIAGNOSIS — I498 Other specified cardiac arrhythmias: Secondary | ICD-10-CM | POA: Diagnosis not present

## 2023-06-07 DIAGNOSIS — R9431 Abnormal electrocardiogram [ECG] [EKG]: Secondary | ICD-10-CM | POA: Diagnosis not present

## 2023-06-07 DIAGNOSIS — E876 Hypokalemia: Secondary | ICD-10-CM | POA: Diagnosis not present

## 2023-06-07 DIAGNOSIS — M329 Systemic lupus erythematosus, unspecified: Secondary | ICD-10-CM | POA: Diagnosis not present

## 2023-06-07 DIAGNOSIS — S72002A Fracture of unspecified part of neck of left femur, initial encounter for closed fracture: Secondary | ICD-10-CM | POA: Diagnosis not present

## 2023-06-07 DIAGNOSIS — W010XXA Fall on same level from slipping, tripping and stumbling without subsequent striking against object, initial encounter: Secondary | ICD-10-CM | POA: Diagnosis not present

## 2023-06-07 DIAGNOSIS — Z743 Need for continuous supervision: Secondary | ICD-10-CM | POA: Diagnosis not present

## 2023-06-07 DIAGNOSIS — T1490XA Injury, unspecified, initial encounter: Secondary | ICD-10-CM | POA: Diagnosis not present

## 2023-06-07 DIAGNOSIS — Z79890 Hormone replacement therapy (postmenopausal): Secondary | ICD-10-CM | POA: Diagnosis not present

## 2023-06-07 DIAGNOSIS — Z79624 Long term (current) use of inhibitors of nucleotide synthesis: Secondary | ICD-10-CM | POA: Diagnosis not present

## 2023-06-08 DIAGNOSIS — S72002A Fracture of unspecified part of neck of left femur, initial encounter for closed fracture: Secondary | ICD-10-CM | POA: Diagnosis not present

## 2023-06-08 DIAGNOSIS — W010XXA Fall on same level from slipping, tripping and stumbling without subsequent striking against object, initial encounter: Secondary | ICD-10-CM | POA: Diagnosis not present

## 2023-06-15 ENCOUNTER — Encounter (INDEPENDENT_AMBULATORY_CARE_PROVIDER_SITE_OTHER): Admitting: Ophthalmology

## 2023-06-23 DIAGNOSIS — Z79899 Other long term (current) drug therapy: Secondary | ICD-10-CM | POA: Diagnosis not present

## 2023-06-23 DIAGNOSIS — M359 Systemic involvement of connective tissue, unspecified: Secondary | ICD-10-CM | POA: Diagnosis not present

## 2023-06-23 DIAGNOSIS — I7300 Raynaud's syndrome without gangrene: Secondary | ICD-10-CM | POA: Diagnosis not present

## 2023-06-23 DIAGNOSIS — M1990 Unspecified osteoarthritis, unspecified site: Secondary | ICD-10-CM | POA: Diagnosis not present

## 2023-06-23 DIAGNOSIS — R748 Abnormal levels of other serum enzymes: Secondary | ICD-10-CM | POA: Diagnosis not present

## 2023-06-23 DIAGNOSIS — M810 Age-related osteoporosis without current pathological fracture: Secondary | ICD-10-CM | POA: Diagnosis not present

## 2023-06-23 DIAGNOSIS — M3390 Dermatopolymyositis, unspecified, organ involvement unspecified: Secondary | ICD-10-CM | POA: Diagnosis not present

## 2023-06-23 DIAGNOSIS — M609 Myositis, unspecified: Secondary | ICD-10-CM | POA: Diagnosis not present

## 2023-06-24 ENCOUNTER — Encounter (INDEPENDENT_AMBULATORY_CARE_PROVIDER_SITE_OTHER): Admitting: Ophthalmology

## 2023-06-24 DIAGNOSIS — I10 Essential (primary) hypertension: Secondary | ICD-10-CM | POA: Diagnosis not present

## 2023-06-24 DIAGNOSIS — H34832 Tributary (branch) retinal vein occlusion, left eye, with macular edema: Secondary | ICD-10-CM | POA: Diagnosis not present

## 2023-06-24 DIAGNOSIS — H33303 Unspecified retinal break, bilateral: Secondary | ICD-10-CM | POA: Diagnosis not present

## 2023-06-24 DIAGNOSIS — H43813 Vitreous degeneration, bilateral: Secondary | ICD-10-CM | POA: Diagnosis not present

## 2023-06-24 DIAGNOSIS — H35033 Hypertensive retinopathy, bilateral: Secondary | ICD-10-CM

## 2023-06-29 DIAGNOSIS — Z5189 Encounter for other specified aftercare: Secondary | ICD-10-CM | POA: Diagnosis not present

## 2023-06-29 DIAGNOSIS — R262 Difficulty in walking, not elsewhere classified: Secondary | ICD-10-CM | POA: Diagnosis not present

## 2023-06-29 DIAGNOSIS — M25552 Pain in left hip: Secondary | ICD-10-CM | POA: Diagnosis not present

## 2023-06-29 DIAGNOSIS — M6281 Muscle weakness (generalized): Secondary | ICD-10-CM | POA: Diagnosis not present

## 2023-07-01 DIAGNOSIS — R262 Difficulty in walking, not elsewhere classified: Secondary | ICD-10-CM | POA: Diagnosis not present

## 2023-07-01 DIAGNOSIS — Z5189 Encounter for other specified aftercare: Secondary | ICD-10-CM | POA: Diagnosis not present

## 2023-07-01 DIAGNOSIS — M6281 Muscle weakness (generalized): Secondary | ICD-10-CM | POA: Diagnosis not present

## 2023-07-01 DIAGNOSIS — M25552 Pain in left hip: Secondary | ICD-10-CM | POA: Diagnosis not present

## 2023-07-07 DIAGNOSIS — R262 Difficulty in walking, not elsewhere classified: Secondary | ICD-10-CM | POA: Diagnosis not present

## 2023-07-07 DIAGNOSIS — Z5189 Encounter for other specified aftercare: Secondary | ICD-10-CM | POA: Diagnosis not present

## 2023-07-07 DIAGNOSIS — M25552 Pain in left hip: Secondary | ICD-10-CM | POA: Diagnosis not present

## 2023-07-07 DIAGNOSIS — M6281 Muscle weakness (generalized): Secondary | ICD-10-CM | POA: Diagnosis not present

## 2023-07-13 DIAGNOSIS — R262 Difficulty in walking, not elsewhere classified: Secondary | ICD-10-CM | POA: Diagnosis not present

## 2023-07-13 DIAGNOSIS — M6281 Muscle weakness (generalized): Secondary | ICD-10-CM | POA: Diagnosis not present

## 2023-07-13 DIAGNOSIS — Z5189 Encounter for other specified aftercare: Secondary | ICD-10-CM | POA: Diagnosis not present

## 2023-07-13 DIAGNOSIS — M25552 Pain in left hip: Secondary | ICD-10-CM | POA: Diagnosis not present

## 2023-07-15 DIAGNOSIS — M6281 Muscle weakness (generalized): Secondary | ICD-10-CM | POA: Diagnosis not present

## 2023-07-15 DIAGNOSIS — Z5189 Encounter for other specified aftercare: Secondary | ICD-10-CM | POA: Diagnosis not present

## 2023-07-15 DIAGNOSIS — R262 Difficulty in walking, not elsewhere classified: Secondary | ICD-10-CM | POA: Diagnosis not present

## 2023-07-15 DIAGNOSIS — M25552 Pain in left hip: Secondary | ICD-10-CM | POA: Diagnosis not present

## 2023-07-19 DIAGNOSIS — Z1212 Encounter for screening for malignant neoplasm of rectum: Secondary | ICD-10-CM | POA: Diagnosis not present

## 2023-07-19 DIAGNOSIS — Z1211 Encounter for screening for malignant neoplasm of colon: Secondary | ICD-10-CM | POA: Diagnosis not present

## 2023-07-20 DIAGNOSIS — Z5189 Encounter for other specified aftercare: Secondary | ICD-10-CM | POA: Diagnosis not present

## 2023-07-20 DIAGNOSIS — M25552 Pain in left hip: Secondary | ICD-10-CM | POA: Diagnosis not present

## 2023-07-20 DIAGNOSIS — R262 Difficulty in walking, not elsewhere classified: Secondary | ICD-10-CM | POA: Diagnosis not present

## 2023-07-20 DIAGNOSIS — M6281 Muscle weakness (generalized): Secondary | ICD-10-CM | POA: Diagnosis not present

## 2023-07-22 ENCOUNTER — Encounter (INDEPENDENT_AMBULATORY_CARE_PROVIDER_SITE_OTHER): Admitting: Ophthalmology

## 2023-07-22 DIAGNOSIS — H34832 Tributary (branch) retinal vein occlusion, left eye, with macular edema: Secondary | ICD-10-CM | POA: Diagnosis not present

## 2023-07-22 DIAGNOSIS — H35033 Hypertensive retinopathy, bilateral: Secondary | ICD-10-CM | POA: Diagnosis not present

## 2023-07-22 DIAGNOSIS — H43813 Vitreous degeneration, bilateral: Secondary | ICD-10-CM

## 2023-07-22 DIAGNOSIS — I10 Essential (primary) hypertension: Secondary | ICD-10-CM

## 2023-07-22 DIAGNOSIS — H33303 Unspecified retinal break, bilateral: Secondary | ICD-10-CM | POA: Diagnosis not present

## 2023-07-23 DIAGNOSIS — M6281 Muscle weakness (generalized): Secondary | ICD-10-CM | POA: Diagnosis not present

## 2023-07-23 DIAGNOSIS — M25552 Pain in left hip: Secondary | ICD-10-CM | POA: Diagnosis not present

## 2023-07-23 DIAGNOSIS — R262 Difficulty in walking, not elsewhere classified: Secondary | ICD-10-CM | POA: Diagnosis not present

## 2023-07-23 DIAGNOSIS — Z5189 Encounter for other specified aftercare: Secondary | ICD-10-CM | POA: Diagnosis not present

## 2023-07-24 LAB — COLOGUARD: COLOGUARD: NEGATIVE

## 2023-07-27 DIAGNOSIS — R262 Difficulty in walking, not elsewhere classified: Secondary | ICD-10-CM | POA: Diagnosis not present

## 2023-07-27 DIAGNOSIS — Z5189 Encounter for other specified aftercare: Secondary | ICD-10-CM | POA: Diagnosis not present

## 2023-07-27 DIAGNOSIS — M25552 Pain in left hip: Secondary | ICD-10-CM | POA: Diagnosis not present

## 2023-07-27 DIAGNOSIS — M6281 Muscle weakness (generalized): Secondary | ICD-10-CM | POA: Diagnosis not present

## 2023-07-29 DIAGNOSIS — M6281 Muscle weakness (generalized): Secondary | ICD-10-CM | POA: Diagnosis not present

## 2023-07-29 DIAGNOSIS — R262 Difficulty in walking, not elsewhere classified: Secondary | ICD-10-CM | POA: Diagnosis not present

## 2023-07-29 DIAGNOSIS — Z5189 Encounter for other specified aftercare: Secondary | ICD-10-CM | POA: Diagnosis not present

## 2023-07-29 DIAGNOSIS — M25552 Pain in left hip: Secondary | ICD-10-CM | POA: Diagnosis not present

## 2023-08-03 DIAGNOSIS — M6281 Muscle weakness (generalized): Secondary | ICD-10-CM | POA: Diagnosis not present

## 2023-08-03 DIAGNOSIS — R262 Difficulty in walking, not elsewhere classified: Secondary | ICD-10-CM | POA: Diagnosis not present

## 2023-08-03 DIAGNOSIS — M25552 Pain in left hip: Secondary | ICD-10-CM | POA: Diagnosis not present

## 2023-08-03 DIAGNOSIS — Z5189 Encounter for other specified aftercare: Secondary | ICD-10-CM | POA: Diagnosis not present

## 2023-08-09 DIAGNOSIS — M6281 Muscle weakness (generalized): Secondary | ICD-10-CM | POA: Diagnosis not present

## 2023-08-09 DIAGNOSIS — M25552 Pain in left hip: Secondary | ICD-10-CM | POA: Diagnosis not present

## 2023-08-09 DIAGNOSIS — R262 Difficulty in walking, not elsewhere classified: Secondary | ICD-10-CM | POA: Diagnosis not present

## 2023-08-09 DIAGNOSIS — Z5189 Encounter for other specified aftercare: Secondary | ICD-10-CM | POA: Diagnosis not present

## 2023-08-11 DIAGNOSIS — Z5189 Encounter for other specified aftercare: Secondary | ICD-10-CM | POA: Diagnosis not present

## 2023-08-11 DIAGNOSIS — R262 Difficulty in walking, not elsewhere classified: Secondary | ICD-10-CM | POA: Diagnosis not present

## 2023-08-11 DIAGNOSIS — M25552 Pain in left hip: Secondary | ICD-10-CM | POA: Diagnosis not present

## 2023-08-11 DIAGNOSIS — M6281 Muscle weakness (generalized): Secondary | ICD-10-CM | POA: Diagnosis not present

## 2023-08-17 DIAGNOSIS — M6281 Muscle weakness (generalized): Secondary | ICD-10-CM | POA: Diagnosis not present

## 2023-08-17 DIAGNOSIS — M25552 Pain in left hip: Secondary | ICD-10-CM | POA: Diagnosis not present

## 2023-08-17 DIAGNOSIS — R262 Difficulty in walking, not elsewhere classified: Secondary | ICD-10-CM | POA: Diagnosis not present

## 2023-08-17 DIAGNOSIS — Z5189 Encounter for other specified aftercare: Secondary | ICD-10-CM | POA: Diagnosis not present

## 2023-08-19 DIAGNOSIS — Z5189 Encounter for other specified aftercare: Secondary | ICD-10-CM | POA: Diagnosis not present

## 2023-08-26 ENCOUNTER — Encounter (INDEPENDENT_AMBULATORY_CARE_PROVIDER_SITE_OTHER): Admitting: Ophthalmology

## 2023-08-26 DIAGNOSIS — H33303 Unspecified retinal break, bilateral: Secondary | ICD-10-CM | POA: Diagnosis not present

## 2023-08-26 DIAGNOSIS — H35033 Hypertensive retinopathy, bilateral: Secondary | ICD-10-CM

## 2023-08-26 DIAGNOSIS — H34832 Tributary (branch) retinal vein occlusion, left eye, with macular edema: Secondary | ICD-10-CM

## 2023-08-26 DIAGNOSIS — H43813 Vitreous degeneration, bilateral: Secondary | ICD-10-CM

## 2023-08-26 DIAGNOSIS — I10 Essential (primary) hypertension: Secondary | ICD-10-CM | POA: Diagnosis not present

## 2023-09-09 DIAGNOSIS — M810 Age-related osteoporosis without current pathological fracture: Secondary | ICD-10-CM | POA: Diagnosis not present

## 2023-09-23 DIAGNOSIS — M3390 Dermatopolymyositis, unspecified, organ involvement unspecified: Secondary | ICD-10-CM | POA: Diagnosis not present

## 2023-09-23 DIAGNOSIS — M359 Systemic involvement of connective tissue, unspecified: Secondary | ICD-10-CM | POA: Diagnosis not present

## 2023-09-23 DIAGNOSIS — I7300 Raynaud's syndrome without gangrene: Secondary | ICD-10-CM | POA: Diagnosis not present

## 2023-09-23 DIAGNOSIS — M609 Myositis, unspecified: Secondary | ICD-10-CM | POA: Diagnosis not present

## 2023-09-23 DIAGNOSIS — M1990 Unspecified osteoarthritis, unspecified site: Secondary | ICD-10-CM | POA: Diagnosis not present

## 2023-09-23 DIAGNOSIS — M810 Age-related osteoporosis without current pathological fracture: Secondary | ICD-10-CM | POA: Diagnosis not present

## 2023-09-23 DIAGNOSIS — Z79899 Other long term (current) drug therapy: Secondary | ICD-10-CM | POA: Diagnosis not present

## 2023-09-23 DIAGNOSIS — R748 Abnormal levels of other serum enzymes: Secondary | ICD-10-CM | POA: Diagnosis not present

## 2023-09-24 ENCOUNTER — Encounter (INDEPENDENT_AMBULATORY_CARE_PROVIDER_SITE_OTHER): Admitting: Ophthalmology

## 2023-09-24 DIAGNOSIS — H34832 Tributary (branch) retinal vein occlusion, left eye, with macular edema: Secondary | ICD-10-CM | POA: Diagnosis not present

## 2023-09-24 DIAGNOSIS — I10 Essential (primary) hypertension: Secondary | ICD-10-CM | POA: Diagnosis not present

## 2023-09-24 DIAGNOSIS — H43813 Vitreous degeneration, bilateral: Secondary | ICD-10-CM

## 2023-09-24 DIAGNOSIS — H35033 Hypertensive retinopathy, bilateral: Secondary | ICD-10-CM

## 2023-10-21 ENCOUNTER — Encounter (INDEPENDENT_AMBULATORY_CARE_PROVIDER_SITE_OTHER): Admitting: Ophthalmology

## 2023-10-21 DIAGNOSIS — H35033 Hypertensive retinopathy, bilateral: Secondary | ICD-10-CM

## 2023-10-21 DIAGNOSIS — H34832 Tributary (branch) retinal vein occlusion, left eye, with macular edema: Secondary | ICD-10-CM

## 2023-10-21 DIAGNOSIS — H43813 Vitreous degeneration, bilateral: Secondary | ICD-10-CM | POA: Diagnosis not present

## 2023-10-21 DIAGNOSIS — I10 Essential (primary) hypertension: Secondary | ICD-10-CM | POA: Diagnosis not present

## 2023-10-21 DIAGNOSIS — H33303 Unspecified retinal break, bilateral: Secondary | ICD-10-CM

## 2023-10-31 DIAGNOSIS — Z6824 Body mass index (BMI) 24.0-24.9, adult: Secondary | ICD-10-CM | POA: Diagnosis not present

## 2023-10-31 DIAGNOSIS — R112 Nausea with vomiting, unspecified: Secondary | ICD-10-CM | POA: Diagnosis not present

## 2023-10-31 DIAGNOSIS — R509 Fever, unspecified: Secondary | ICD-10-CM | POA: Diagnosis not present

## 2023-11-02 DIAGNOSIS — R509 Fever, unspecified: Secondary | ICD-10-CM | POA: Diagnosis not present

## 2023-11-02 DIAGNOSIS — R1110 Vomiting, unspecified: Secondary | ICD-10-CM | POA: Diagnosis not present

## 2023-11-02 DIAGNOSIS — U071 COVID-19: Secondary | ICD-10-CM | POA: Diagnosis not present

## 2023-11-02 DIAGNOSIS — Z20828 Contact with and (suspected) exposure to other viral communicable diseases: Secondary | ICD-10-CM | POA: Diagnosis not present

## 2023-11-02 DIAGNOSIS — Z6823 Body mass index (BMI) 23.0-23.9, adult: Secondary | ICD-10-CM | POA: Diagnosis not present

## 2023-11-02 DIAGNOSIS — M329 Systemic lupus erythematosus, unspecified: Secondary | ICD-10-CM | POA: Diagnosis not present

## 2023-11-16 DIAGNOSIS — N1831 Chronic kidney disease, stage 3a: Secondary | ICD-10-CM | POA: Diagnosis not present

## 2023-11-16 DIAGNOSIS — E876 Hypokalemia: Secondary | ICD-10-CM | POA: Diagnosis not present

## 2023-11-16 DIAGNOSIS — E7849 Other hyperlipidemia: Secondary | ICD-10-CM | POA: Diagnosis not present

## 2023-11-16 DIAGNOSIS — E039 Hypothyroidism, unspecified: Secondary | ICD-10-CM | POA: Diagnosis not present

## 2023-11-22 DIAGNOSIS — Z1331 Encounter for screening for depression: Secondary | ICD-10-CM | POA: Diagnosis not present

## 2023-11-22 DIAGNOSIS — Z0000 Encounter for general adult medical examination without abnormal findings: Secondary | ICD-10-CM | POA: Diagnosis not present

## 2023-11-22 DIAGNOSIS — Z1389 Encounter for screening for other disorder: Secondary | ICD-10-CM | POA: Diagnosis not present

## 2023-11-22 DIAGNOSIS — Z0001 Encounter for general adult medical examination with abnormal findings: Secondary | ICD-10-CM | POA: Diagnosis not present

## 2023-11-22 DIAGNOSIS — Z6823 Body mass index (BMI) 23.0-23.9, adult: Secondary | ICD-10-CM | POA: Diagnosis not present

## 2023-11-23 ENCOUNTER — Encounter (INDEPENDENT_AMBULATORY_CARE_PROVIDER_SITE_OTHER): Admitting: Ophthalmology

## 2023-11-23 DIAGNOSIS — H33303 Unspecified retinal break, bilateral: Secondary | ICD-10-CM

## 2023-11-23 DIAGNOSIS — H43813 Vitreous degeneration, bilateral: Secondary | ICD-10-CM | POA: Diagnosis not present

## 2023-11-23 DIAGNOSIS — F339 Major depressive disorder, recurrent, unspecified: Secondary | ICD-10-CM | POA: Diagnosis not present

## 2023-11-23 DIAGNOSIS — H34832 Tributary (branch) retinal vein occlusion, left eye, with macular edema: Secondary | ICD-10-CM

## 2023-11-23 DIAGNOSIS — H35033 Hypertensive retinopathy, bilateral: Secondary | ICD-10-CM

## 2023-11-23 DIAGNOSIS — M329 Systemic lupus erythematosus, unspecified: Secondary | ICD-10-CM | POA: Diagnosis not present

## 2023-11-23 DIAGNOSIS — Z6823 Body mass index (BMI) 23.0-23.9, adult: Secondary | ICD-10-CM | POA: Diagnosis not present

## 2023-11-23 DIAGNOSIS — Z23 Encounter for immunization: Secondary | ICD-10-CM | POA: Diagnosis not present

## 2023-11-23 DIAGNOSIS — E7849 Other hyperlipidemia: Secondary | ICD-10-CM | POA: Diagnosis not present

## 2023-11-23 DIAGNOSIS — I10 Essential (primary) hypertension: Secondary | ICD-10-CM

## 2023-11-23 DIAGNOSIS — R4582 Worries: Secondary | ICD-10-CM | POA: Diagnosis not present

## 2023-11-23 DIAGNOSIS — E782 Mixed hyperlipidemia: Secondary | ICD-10-CM | POA: Diagnosis not present

## 2023-11-23 DIAGNOSIS — Z1331 Encounter for screening for depression: Secondary | ICD-10-CM | POA: Diagnosis not present

## 2023-11-23 DIAGNOSIS — Z0001 Encounter for general adult medical examination with abnormal findings: Secondary | ICD-10-CM | POA: Diagnosis not present

## 2023-12-07 DIAGNOSIS — R300 Dysuria: Secondary | ICD-10-CM | POA: Diagnosis not present

## 2023-12-16 DIAGNOSIS — R4582 Worries: Secondary | ICD-10-CM | POA: Diagnosis not present

## 2023-12-16 DIAGNOSIS — Z6823 Body mass index (BMI) 23.0-23.9, adult: Secondary | ICD-10-CM | POA: Diagnosis not present

## 2023-12-16 DIAGNOSIS — R051 Acute cough: Secondary | ICD-10-CM | POA: Diagnosis not present

## 2023-12-16 DIAGNOSIS — Z1331 Encounter for screening for depression: Secondary | ICD-10-CM | POA: Diagnosis not present

## 2023-12-24 DIAGNOSIS — R748 Abnormal levels of other serum enzymes: Secondary | ICD-10-CM | POA: Diagnosis not present

## 2023-12-24 DIAGNOSIS — M359 Systemic involvement of connective tissue, unspecified: Secondary | ICD-10-CM | POA: Diagnosis not present

## 2023-12-24 DIAGNOSIS — M1990 Unspecified osteoarthritis, unspecified site: Secondary | ICD-10-CM | POA: Diagnosis not present

## 2023-12-24 DIAGNOSIS — M609 Myositis, unspecified: Secondary | ICD-10-CM | POA: Diagnosis not present

## 2023-12-24 DIAGNOSIS — Z79899 Other long term (current) drug therapy: Secondary | ICD-10-CM | POA: Diagnosis not present

## 2023-12-24 DIAGNOSIS — R300 Dysuria: Secondary | ICD-10-CM | POA: Diagnosis not present

## 2023-12-24 DIAGNOSIS — I7300 Raynaud's syndrome without gangrene: Secondary | ICD-10-CM | POA: Diagnosis not present

## 2023-12-24 DIAGNOSIS — M3390 Dermatopolymyositis, unspecified, organ involvement unspecified: Secondary | ICD-10-CM | POA: Diagnosis not present

## 2023-12-24 DIAGNOSIS — M810 Age-related osteoporosis without current pathological fracture: Secondary | ICD-10-CM | POA: Diagnosis not present

## 2023-12-28 ENCOUNTER — Encounter (INDEPENDENT_AMBULATORY_CARE_PROVIDER_SITE_OTHER): Admitting: Ophthalmology

## 2023-12-28 DIAGNOSIS — H34832 Tributary (branch) retinal vein occlusion, left eye, with macular edema: Secondary | ICD-10-CM

## 2023-12-28 DIAGNOSIS — H43813 Vitreous degeneration, bilateral: Secondary | ICD-10-CM

## 2023-12-28 DIAGNOSIS — H35033 Hypertensive retinopathy, bilateral: Secondary | ICD-10-CM | POA: Diagnosis not present

## 2023-12-28 DIAGNOSIS — I10 Essential (primary) hypertension: Secondary | ICD-10-CM

## 2024-02-01 ENCOUNTER — Encounter (INDEPENDENT_AMBULATORY_CARE_PROVIDER_SITE_OTHER): Admitting: Ophthalmology

## 2024-02-01 DIAGNOSIS — H35033 Hypertensive retinopathy, bilateral: Secondary | ICD-10-CM

## 2024-02-01 DIAGNOSIS — I10 Essential (primary) hypertension: Secondary | ICD-10-CM | POA: Diagnosis not present

## 2024-02-01 DIAGNOSIS — H43813 Vitreous degeneration, bilateral: Secondary | ICD-10-CM | POA: Diagnosis not present

## 2024-02-01 DIAGNOSIS — H33303 Unspecified retinal break, bilateral: Secondary | ICD-10-CM | POA: Diagnosis not present

## 2024-02-01 DIAGNOSIS — H34832 Tributary (branch) retinal vein occlusion, left eye, with macular edema: Secondary | ICD-10-CM | POA: Diagnosis not present

## 2024-03-07 ENCOUNTER — Encounter (INDEPENDENT_AMBULATORY_CARE_PROVIDER_SITE_OTHER): Admitting: Ophthalmology

## 2024-03-09 ENCOUNTER — Encounter (INDEPENDENT_AMBULATORY_CARE_PROVIDER_SITE_OTHER): Admitting: Ophthalmology

## 2024-03-09 DIAGNOSIS — H43813 Vitreous degeneration, bilateral: Secondary | ICD-10-CM | POA: Diagnosis not present

## 2024-03-09 DIAGNOSIS — I10 Essential (primary) hypertension: Secondary | ICD-10-CM | POA: Diagnosis not present

## 2024-03-09 DIAGNOSIS — H34832 Tributary (branch) retinal vein occlusion, left eye, with macular edema: Secondary | ICD-10-CM | POA: Diagnosis not present

## 2024-03-09 DIAGNOSIS — H35033 Hypertensive retinopathy, bilateral: Secondary | ICD-10-CM

## 2024-04-20 ENCOUNTER — Encounter (INDEPENDENT_AMBULATORY_CARE_PROVIDER_SITE_OTHER): Admitting: Ophthalmology
